# Patient Record
Sex: Female | Born: 2000
Health system: Southern US, Community
[De-identification: ages and names within clinical notes are randomized; demographics above are authoritative.]

## PROBLEM LIST (undated history)

## (undated) DIAGNOSIS — K219 Gastro-esophageal reflux disease without esophagitis: Secondary | ICD-10-CM

## (undated) DIAGNOSIS — D649 Anemia, unspecified: Secondary | ICD-10-CM

## (undated) DIAGNOSIS — Z789 Other specified health status: Secondary | ICD-10-CM

## (undated) DIAGNOSIS — F419 Anxiety disorder, unspecified: Secondary | ICD-10-CM

## (undated) HISTORY — DX: Anxiety disorder, unspecified: F41.9

## (undated) HISTORY — PX: NO PAST SURGERIES: SHX2092

## (undated) HISTORY — DX: Anemia, unspecified: D64.9

---
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
## 2020-01-07 ENCOUNTER — Telehealth (HOSPITAL_COMMUNITY): Payer: Self-pay | Admitting: *Deleted

## 2020-01-07 ENCOUNTER — Encounter (HOSPITAL_COMMUNITY): Payer: Self-pay | Admitting: *Deleted

## 2020-01-07 NOTE — Telephone Encounter (Signed)
Preadmission screen  

## 2020-01-08 ENCOUNTER — Other Ambulatory Visit (HOSPITAL_COMMUNITY)
Admission: RE | Admit: 2020-01-08 | Discharge: 2020-01-08 | Disposition: A | Discharge status: Home / Self Care | Payer: Medicaid Other | Source: Ambulatory Visit | Attending: Obstetrics and Gynecology | Admitting: Obstetrics and Gynecology

## 2020-01-08 DIAGNOSIS — Z20822 Contact with and (suspected) exposure to covid-19: Secondary | ICD-10-CM | POA: Insufficient documentation

## 2020-01-08 DIAGNOSIS — Z01812 Encounter for preprocedural laboratory examination: Secondary | ICD-10-CM | POA: Insufficient documentation

## 2020-01-08 LAB — SARS CORONAVIRUS 2 (TAT 6-24 HRS): SARS Coronavirus 2: NEGATIVE

## 2020-01-09 ENCOUNTER — Encounter (HOSPITAL_COMMUNITY): Payer: Medicaid Other

## 2020-01-10 ENCOUNTER — Other Ambulatory Visit: Payer: Self-pay

## 2020-01-10 ENCOUNTER — Inpatient Hospital Stay (HOSPITAL_COMMUNITY)
Admission: AD | Admit: 2020-01-10 | Discharge: 2020-01-12 | DRG: 806 | Disposition: A | Discharge status: Home / Self Care | Payer: Medicaid Other | Attending: Obstetrics and Gynecology | Admitting: Obstetrics and Gynecology

## 2020-01-10 ENCOUNTER — Inpatient Hospital Stay (HOSPITAL_COMMUNITY): Payer: Medicaid Other

## 2020-01-10 ENCOUNTER — Inpatient Hospital Stay (HOSPITAL_COMMUNITY): Payer: Medicaid Other | Admitting: Anesthesiology

## 2020-01-10 ENCOUNTER — Encounter (HOSPITAL_COMMUNITY): Payer: Self-pay | Admitting: Obstetrics and Gynecology

## 2020-01-10 DIAGNOSIS — D62 Acute posthemorrhagic anemia: Secondary | ICD-10-CM | POA: Diagnosis not present

## 2020-01-10 DIAGNOSIS — F419 Anxiety disorder, unspecified: Secondary | ICD-10-CM | POA: Diagnosis present

## 2020-01-10 DIAGNOSIS — O9081 Anemia of the puerperium: Secondary | ICD-10-CM | POA: Diagnosis not present

## 2020-01-10 DIAGNOSIS — O99344 Other mental disorders complicating childbirth: Secondary | ICD-10-CM | POA: Diagnosis present

## 2020-01-10 DIAGNOSIS — O480 Post-term pregnancy: Principal | ICD-10-CM | POA: Diagnosis present

## 2020-01-10 DIAGNOSIS — O99214 Obesity complicating childbirth: Secondary | ICD-10-CM | POA: Diagnosis present

## 2020-01-10 DIAGNOSIS — R030 Elevated blood-pressure reading, without diagnosis of hypertension: Secondary | ICD-10-CM | POA: Diagnosis not present

## 2020-01-10 DIAGNOSIS — Z3490 Encounter for supervision of normal pregnancy, unspecified, unspecified trimester: Secondary | ICD-10-CM

## 2020-01-10 DIAGNOSIS — Z3A40 40 weeks gestation of pregnancy: Secondary | ICD-10-CM

## 2020-01-10 DIAGNOSIS — O99893 Other specified diseases and conditions complicating puerperium: Secondary | ICD-10-CM | POA: Diagnosis not present

## 2020-01-10 LAB — TYPE AND SCREEN
ABO/RH(D): O POS
Antibody Screen: NEGATIVE

## 2020-01-10 LAB — CBC
HCT: 33 % — ABNORMAL LOW (ref 36.0–46.0)
Hemoglobin: 10.7 g/dL — ABNORMAL LOW (ref 12.0–15.0)
MCH: 26.8 pg (ref 26.0–34.0)
MCHC: 32.4 g/dL (ref 30.0–36.0)
MCV: 82.7 fL (ref 80.0–100.0)
Platelets: 338 10*3/uL (ref 150–400)
RBC: 3.99 MIL/uL (ref 3.87–5.11)
RDW: 13.9 % (ref 11.5–15.5)
WBC: 9.6 10*3/uL (ref 4.0–10.5)
nRBC: 0 % (ref 0.0–0.2)

## 2020-01-10 LAB — RPR: RPR Ser Ql: NONREACTIVE

## 2020-01-10 MED ORDER — COCONUT OIL OIL: 1.0000 "application " | TOPICAL_OIL | Status: DC | PRN

## 2020-01-10 MED ORDER — LIDOCAINE HCL (PF) 1 % IJ SOLN
INTRAMUSCULAR | Status: DC | PRN
  Administered 2020-01-10 (×2): 4 mL via EPIDURAL

## 2020-01-10 MED ORDER — LIDOCAINE HCL (PF) 1 % IJ SOLN: 30.0000 mL | INTRAMUSCULAR | Status: DC | PRN

## 2020-01-10 MED ORDER — METHYLERGONOVINE MALEATE 0.2 MG/ML IJ SOLN: 0.2000 mg | INTRAMUSCULAR | Status: DC | PRN

## 2020-01-10 MED ORDER — TETANUS-DIPHTH-ACELL PERTUSSIS 5-2.5-18.5 LF-MCG/0.5 IM SUSP: 0.5000 mL | Freq: Once | INTRAMUSCULAR | Status: DC

## 2020-01-10 MED ORDER — PHENYLEPHRINE 40 MCG/ML (10ML) SYRINGE FOR IV PUSH (FOR BLOOD PRESSURE SUPPORT): 80.0000 ug | PREFILLED_SYRINGE | INTRAVENOUS | Status: DC | PRN

## 2020-01-10 MED ORDER — LACTATED RINGERS IV SOLN: 500.0000 mL | INTRAVENOUS | Status: DC | PRN

## 2020-01-10 MED ORDER — ONDANSETRON HCL 4 MG/2ML IJ SOLN: 4.0000 mg | Freq: Four times a day (QID) | INTRAMUSCULAR | Status: DC | PRN

## 2020-01-10 MED ORDER — OXYTOCIN BOLUS FROM INFUSION
333.0000 mL | Freq: Once | INTRAVENOUS | Status: AC
  Administered 2020-01-10: 333 mL via INTRAVENOUS

## 2020-01-10 MED ORDER — FENTANYL-BUPIVACAINE-NACL 0.5-0.125-0.9 MG/250ML-% EP SOLN: 12.0000 mL/h | EPIDURAL | Status: DC | PRN

## 2020-01-10 MED ORDER — SIMETHICONE 80 MG PO CHEW: 80.0000 mg | CHEWABLE_TABLET | ORAL | Status: DC | PRN

## 2020-01-10 MED ORDER — SOD CITRATE-CITRIC ACID 500-334 MG/5ML PO SOLN: 30.0000 mL | ORAL | Status: DC | PRN

## 2020-01-10 MED ORDER — OXYCODONE HCL 5 MG PO TABS: 5.0000 mg | ORAL_TABLET | ORAL | Status: DC | PRN

## 2020-01-10 MED ORDER — OXYTOCIN-SODIUM CHLORIDE 30-0.9 UT/500ML-% IV SOLN
1.0000 m[IU]/min | INTRAVENOUS | Status: DC
  Administered 2020-01-10: 2 m[IU]/min via INTRAVENOUS

## 2020-01-10 MED ORDER — LACTATED RINGERS IV SOLN: 500.0000 mL | Freq: Once | INTRAVENOUS | Status: DC

## 2020-01-10 MED ORDER — OXYCODONE-ACETAMINOPHEN 5-325 MG PO TABS: 2.0000 | ORAL_TABLET | ORAL | Status: DC | PRN

## 2020-01-10 MED ORDER — OXYCODONE-ACETAMINOPHEN 5-325 MG PO TABS: 1.0000 | ORAL_TABLET | ORAL | Status: DC | PRN

## 2020-01-10 MED ORDER — FENTANYL-BUPIVACAINE-NACL 0.5-0.125-0.9 MG/250ML-% EP SOLN
EPIDURAL | Status: AC
  Filled 2020-01-10: qty 250

## 2020-01-10 MED ORDER — DIBUCAINE (PERIANAL) 1 % EX OINT: 1.0000 "application " | TOPICAL_OINTMENT | CUTANEOUS | Status: DC | PRN

## 2020-01-10 MED ORDER — DIPHENHYDRAMINE HCL 25 MG PO CAPS: 25.0000 mg | ORAL_CAPSULE | Freq: Four times a day (QID) | ORAL | Status: DC | PRN

## 2020-01-10 MED ORDER — IBUPROFEN 600 MG PO TABS
600.0000 mg | ORAL_TABLET | Freq: Four times a day (QID) | ORAL | Status: DC
  Administered 2020-01-10 – 2020-01-12 (×7): 600 mg via ORAL
  Filled 2020-01-10 (×7): qty 1

## 2020-01-10 MED ORDER — LACTATED RINGERS IV SOLN: INTRAVENOUS | Status: DC

## 2020-01-10 MED ORDER — SENNOSIDES-DOCUSATE SODIUM 8.6-50 MG PO TABS
2.0000 | ORAL_TABLET | ORAL | Status: DC
  Administered 2020-01-11: 2 via ORAL
  Filled 2020-01-10 (×2): qty 2

## 2020-01-10 MED ORDER — ONDANSETRON HCL 4 MG PO TABS: 4.0000 mg | ORAL_TABLET | ORAL | Status: DC | PRN

## 2020-01-10 MED ORDER — EPHEDRINE 5 MG/ML INJ: 10.0000 mg | INTRAVENOUS | Status: DC | PRN

## 2020-01-10 MED ORDER — SODIUM CHLORIDE (PF) 0.9 % IJ SOLN
INTRAMUSCULAR | Status: DC | PRN
  Administered 2020-01-10: 12 mL/h via EPIDURAL

## 2020-01-10 MED ORDER — ACETAMINOPHEN 325 MG PO TABS: 650.0000 mg | ORAL_TABLET | ORAL | Status: DC | PRN

## 2020-01-10 MED ORDER — FENTANYL CITRATE (PF) 100 MCG/2ML IJ SOLN: 100.0000 ug | INTRAMUSCULAR | Status: DC | PRN

## 2020-01-10 MED ORDER — TERBUTALINE SULFATE 1 MG/ML IJ SOLN: 0.2500 mg | Freq: Once | INTRAMUSCULAR | Status: DC | PRN

## 2020-01-10 MED ORDER — DIPHENHYDRAMINE HCL 50 MG/ML IJ SOLN: 12.5000 mg | INTRAMUSCULAR | Status: DC | PRN

## 2020-01-10 MED ORDER — WITCH HAZEL-GLYCERIN EX PADS: 1.0000 "application " | MEDICATED_PAD | CUTANEOUS | Status: DC | PRN

## 2020-01-10 MED ORDER — METHYLERGONOVINE MALEATE 0.2 MG PO TABS: 0.2000 mg | ORAL_TABLET | ORAL | Status: DC | PRN

## 2020-01-10 MED ORDER — MISOPROSTOL 25 MCG QUARTER TABLET
25.0000 ug | ORAL_TABLET | ORAL | Status: DC | PRN
  Filled 2020-01-10: qty 1

## 2020-01-10 MED ORDER — LACTATED RINGERS IV SOLN
500.0000 mL | Freq: Once | INTRAVENOUS | Status: AC
  Administered 2020-01-10: 500 mL via INTRAVENOUS

## 2020-01-10 MED ORDER — OXYTOCIN-SODIUM CHLORIDE 30-0.9 UT/500ML-% IV SOLN
2.5000 [IU]/h | INTRAVENOUS | Status: DC
  Filled 2020-01-10: qty 500

## 2020-01-10 MED ORDER — ZOLPIDEM TARTRATE 5 MG PO TABS: 5.0000 mg | ORAL_TABLET | Freq: Every evening | ORAL | Status: DC | PRN

## 2020-01-10 MED ORDER — PRENATAL MULTIVITAMIN CH
1.0000 | ORAL_TABLET | Freq: Every day | ORAL | Status: DC
  Administered 2020-01-11: 1 via ORAL
  Filled 2020-01-10: qty 1

## 2020-01-10 MED ORDER — OXYCODONE HCL 5 MG PO TABS: 10.0000 mg | ORAL_TABLET | ORAL | Status: DC | PRN

## 2020-01-10 MED ORDER — BENZOCAINE-MENTHOL 20-0.5 % EX AERO: 1.0000 "application " | INHALATION_SPRAY | CUTANEOUS | Status: DC | PRN

## 2020-01-10 MED ORDER — ONDANSETRON HCL 4 MG/2ML IJ SOLN: 4.0000 mg | INTRAMUSCULAR | Status: DC | PRN

## 2020-01-10 NOTE — H&P (Signed)
Jamie Weeks is a 19 y.o. female presenting for postdates IOL  19 yo G1P0 presents for a postdates IOL. Her pregnancy has been complicated by teen pregnancy, anxiety, and anemia. Patient and partner broke up at beginning of pregnancy. Pt appears to have reconciled and has good social support. OB History    Gravida  1   Para      Term      Preterm      AB      Living        SAB      TAB      Ectopic      Multiple      Live Births             Past Medical History:  Diagnosis Date  . Anemia   . Anxiety   . Medical history non-contributory    Past Surgical History:  Procedure Laterality Date  . NO PAST SURGERIES     Family History: family history includes Heart attack in her father; Heart failure in her mother; Hypertension in her mother. Social History:  reports that she has never smoked. She has never used smokeless tobacco. She reports previous alcohol use. She reports previous drug use. Drug: Marijuana.     Maternal Diabetes: No Genetic Screening: Declined, late PNC Maternal Ultrasounds/Referrals: Normal Fetal Ultrasounds or other Referrals:  None Maternal Substance Abuse:  No Significant Maternal Medications:  None Significant Maternal Lab Results:  None Other Comments:  None  Review of Systems History Dilation: 10 Effacement (%): 100 Station: 0 Exam by:: [employer] RN Blood pressure (!) 140/91, pulse 90, temperature 98.4 F (36.9 C), resp. rate 16, last menstrual period 02/18/2019, SpO2 100 %. Exam Physical Exam  Prenatal labs: ABO, Rh: --/--/O POS (07/26 0815) Antibody: NEG (07/26 0815) Rubella: Immune (12/14 0000) RPR: NON REACTIVE (07/26 0815)  HBsAg: Negative (12/14 0000)  HIV: Non-reactive (12/14 0000)  GBS: Negative/-- (06/24 0000)   Assessment/Plan: 1) admit 2) AROM/pit 3) Epidural on request    Waynard Reeds 01/10/2020, 12:41 PM

## 2020-01-10 NOTE — Anesthesia Postprocedure Evaluation (Signed)
Anesthesia Post Note  Patient: MARCHE HOTTENSTEIN  Procedure(s) Performed: AN AD HOC LABOR EPIDURAL     Patient location during evaluation: Mother Baby Anesthesia Type: Epidural Level of consciousness: awake and alert Pain management: pain level controlled Vital Signs Assessment: post-procedure vital signs reviewed and stable Respiratory status: spontaneous breathing, nonlabored ventilation and respiratory function stable Cardiovascular status: stable Postop Assessment: no headache, no backache and epidural receding Anesthetic complications: no   No complications documented.  Last Vitals:  Vitals:   01/10/20 1413 01/10/20 1545  BP: (!) 137/86 (!) 129/68  Pulse: 84 105  Resp: 16   Temp: 36.8 C 36.7 C  SpO2: 100% 100%    Last Pain:  Vitals:   01/10/20 1545  TempSrc: Oral  PainSc: 0-No pain   Pain Goal:                   Tahara Ruffini

## 2020-01-10 NOTE — Anesthesia Procedure Notes (Signed)
Epidural Patient location during procedure: OB Start time: 01/10/2020 8:51 AM End time: 01/10/2020 8:59 AM  Staffing Anesthesiologist: Mal Amabile, MD Performed: anesthesiologist   Preanesthetic Checklist Completed: patient identified, IV checked, site marked, risks and benefits discussed, surgical consent, monitors and equipment checked, pre-op evaluation and timeout performed  Epidural Patient position: sitting Prep: DuraPrep and site prepped and draped Patient monitoring: continuous pulse ox and blood pressure Approach: midline Location: L3-L4 Injection technique: LOR air  Needle:  Needle type: Tuohy  Needle gauge: 17 G Needle length: 9 cm and 9 Needle insertion depth: 8 cm Catheter type: closed end flexible Catheter size: 19 Gauge Catheter at skin depth: 12 cm Test dose: negative and Other  Assessment Events: blood not aspirated, injection not painful, no injection resistance, no paresthesia and negative IV test  Additional Notes Patient identified. Risks and benefits discussed including failed block, incomplete  Pain control, post dural puncture headache, nerve damage, paralysis, blood pressure Changes, nausea, vomiting, reactions to medications-both toxic and allergic and post Partum back pain. All questions were answered. Patient expressed understanding and wished to proceed. Sterile technique was used throughout procedure. Epidural site was Dressed with sterile barrier dressing. No paresthesias, signs of intravascular injection Or signs of intrathecal spread were encountered.  Patient was more comfortable after the epidural was dosed. Please see RN's note for documentation of vital signs and FHR which are stable. Reason for block:procedure for pain

## 2020-01-10 NOTE — Anesthesia Preprocedure Evaluation (Addendum)
Anesthesia Evaluation  Patient identified by MRN, date of birth, ID band Patient awake    Reviewed: Allergy & Precautions, H&P , Patient's Chart, lab work & pertinent test results  Airway Mallampati: II  TM Distance: >3 FB Neck ROM: full    Dental no notable dental hx. (+) Teeth Intact   Pulmonary neg pulmonary ROS,    Pulmonary exam normal breath sounds clear to auscultation       Cardiovascular negative cardio ROS Normal cardiovascular exam Rhythm:regular Rate:Normal     Neuro/Psych negative neurological ROS  negative psych ROS   GI/Hepatic negative GI ROS, Neg liver ROS,   Endo/Other  negative endocrine ROS  Renal/GU negative Renal ROS  negative genitourinary   Musculoskeletal   Abdominal   Peds  Hematology negative hematology ROS (+) Blood dyscrasia, anemia ,   Anesthesia Other Findings   Reproductive/Obstetrics (+) Pregnancy                             Anesthesia Physical Anesthesia Plan  ASA: II  Anesthesia Plan: Epidural   Post-op Pain Management:    Induction:   PONV Risk Score and Plan:   Airway Management Planned:   Additional Equipment:   Intra-op Plan:   Post-operative Plan:   Informed Consent: I have reviewed the patients History and Physical, chart, labs and discussed the procedure including the risks, benefits and alternatives for the proposed anesthesia with the patient or authorized representative who has indicated his/her understanding and acceptance.       Plan Discussed with: Anesthesiologist  Anesthesia Plan Comments:         Anesthesia Quick Evaluation

## 2020-01-11 LAB — CBC
HCT: 30.3 % — ABNORMAL LOW (ref 36.0–46.0)
Hemoglobin: 9.8 g/dL — ABNORMAL LOW (ref 12.0–15.0)
MCH: 26.5 pg (ref 26.0–34.0)
MCHC: 32.3 g/dL (ref 30.0–36.0)
MCV: 81.9 fL (ref 80.0–100.0)
Platelets: 273 10*3/uL (ref 150–400)
RBC: 3.7 MIL/uL — ABNORMAL LOW (ref 3.87–5.11)
RDW: 14.3 % (ref 11.5–15.5)
WBC: 9.1 10*3/uL (ref 4.0–10.5)
nRBC: 0 % (ref 0.0–0.2)

## 2020-01-11 MED ORDER — NORETHINDRONE 0.35 MG PO TABS: 1.0000 | ORAL_TABLET | Freq: Every day | ORAL | 11 refills | Status: DC

## 2020-01-11 MED FILL — NORLYDA 0.35 MG TABS: 0.35 | 28 days supply | Qty: 28 | Fill #0

## 2020-01-11 NOTE — Lactation Note (Signed)
This note was copied from a Jamie's chart. Lactation Consultation Note  Patient Name: Jamie Weeks Date: 01/11/2020  Jamie Weeks now 64 hours old.  Parents report they feel she is breastfeeding well and deny need for lactation assistance at this time.  Parents report no breastfeeding education. Mom reports she has a breast pump for home use. Urged parents to offer the breast based on hunger cues and at least 8-12 or more times day. Reviewed and left [facility] breastfeeding resource list.  Urged parents to call lactation as needed.   Maternal Data    Feeding Feeding Type: Breast Fed  [facility] Score                   Interventions    Lactation Tools Discussed/Used     Consult Status      Soniya Ashraf Michaelle Copas 01/11/2020, 4:24 PM

## 2020-01-11 NOTE — Progress Notes (Signed)
CSW received consult for history of anxiety and depression.  CSW met with MOB to offer support and complete assessment.    MOB resting in bed holding infant with MOB's mother at bedside, when CSW entered the room. MOB's mother left prior to CSW completing assessment. CW introduced self and explained reason for consult to which MOB expressed understanding. MOB pleasant and easy to engage throughout assessment. CSW inquired about MOB's mental health history to which MOB acknowledged history of anxiety and depression since the middle of her pregnancy. MOB confirmed being started on Zoloft during her pregnancy but stated she no longer takes it as she did not like how it made her feel. MOB denied any interest in being restarted on medications or counseling resources. Per MOB, she has a good support system consisting of her mother, cousin and FOB/boyfriend. CSW provided education regarding the baby blues period vs. perinatal mood disorders, discussed treatment and gave resources for mental health follow up if concerns arise. CSW recommends self-evaluation during the postpartum time period using the New Mom Checklist from Postpartum Progress and encouraged MOB to contact a medical professional if symptoms are noted at any time. MOB denied any current SI, HI or DV. CSW aware MOB seen by CSW in 07/2019 for concerns with boyfriend. CSW verified the boyfriend from previous visit was the same boyfriend she was with now. MOB again denied any DV and denied any safety concerns once discharged.   MOB confirmed having all essential items for infant once discharged and stated infant would be sleeping in a crib once home. CSW provided review of Sudden Infant Death Syndrome (SIDS) precautions and safe sleeping habits.    CSW identifies no further need for intervention and no barriers to discharge at this time.  Jamie Asberry, LCSW [facility] [phone]  

## 2020-01-11 NOTE — Progress Notes (Signed)
Post Partum Day 1 Subjective: no complaints, up ad lib, voiding and tolerating PO  Objective: Vitals:   01/10/20 1545 01/10/20 1953 01/11/20 0052 01/11/20 0511  BP: (!) 129/68 (!) 129/90 (!) 124/92 107/69  Pulse: 105 93 91 70  Resp:  17 16 16   Temp: 98 F (36.7 C) 98.2 F (36.8 C) 98.6 F (37 C) 98 F (36.7 C)  TempSrc: Oral Oral Oral Oral  SpO2: 100% 100% 100% 100%     Physical Exam:  General: alert Lochia: appropriate Uterine Fundus: firm DVT Evaluation: No evidence of DVT seen on physical exam.  Recent Labs    01/10/20 0815 01/11/20 0516  WBC 9.6 9.1  HGB 10.7* 9.8*  HCT 33.0* 30.3*  PLT 338 273   Assessment/Plan: 01/13/20 19 y.o. G1P1001 PPD#1 sp TSVD 1. PPC: Hgb 10.7>EBL 27cc, no lacerations > hgb 9.8. Anemia - plan for PO iron at discharge. Rubella immune, blood type O+, breast & bottlefeeding, baby girl Contraception - discussed options, patient desires POPs at discharge and then consider patch vs. IUD at pp visit. Counseled on pelvic rest x6 weeks 2. Chlamydia in pregnancy: positive 12/09/2019, s/p treatment 3. Anxiety: was on zoloft in pregnancy, self d/c due to lack of improvement. Reports mood is stable.  4. Obesity - encouraged ambulation 5. Elevated BP - around delivery and diastolic elevated x2 after delivery. Normotensive this morning.  If BP remains normotensive, and patient desires, OK for 24 hour pp discharge.   LOS: 1 day   Augustine Brannick K Taam-Akelman 01/11/2020, 11:00 AM

## 2020-01-11 NOTE — Lactation Note (Signed)
This note was copied from a baby's chart. Lactation Consultation Note  Patient Name: Jamie Weeks Date: 01/11/2020 Reason for consult: Follow-up assessment   P1, Baby 21 hours old.  Reviewed hand expression w/ drops expressed. Mother states she recently breastfed infant for approx 10 min. Baby sleeping beside mother.  Was feeding in football hold. Reviewed basics including cluster feeding, frequency, feedings cues and provided education on pacifier use. Pacifier use not recommended at this time.  Feed on demand with cues.  Goal 8-12+ times per day after first 24 hrs.  Place baby STS if not cueing.  Mom made aware of O/P services, breastfeeding support groups, community resources, and our phone # for post-discharge questions.     Maternal Data Has patient been taught Hand Expression?: Yes Does the patient have breastfeeding experience prior to this delivery?: No  Feeding Feeding Type: Breast Fed  LATCH Score Latch: Repeated attempts needed to sustain latch, nipple held in mouth throughout feeding, stimulation needed to elicit sucking reflex.  Audible Swallowing: A few with stimulation  Type of Nipple: Everted at rest and after stimulation  Comfort (Breast/Nipple): Soft / non-tender  Hold (Positioning): Assistance needed to correctly position infant at breast and maintain latch.  LATCH Score: 7  Interventions Interventions: Breast feeding basics reviewed;Hand express  Lactation Tools Discussed/Used     Consult Status Consult Status: Follow-up Date: 01/12/20 Follow-up type: In-patient    Dahlia Byes [facility] 01/11/2020, 9:43 AM

## 2020-01-12 NOTE — Discharge Summary (Signed)
Postpartum Discharge Summary     Patient Name: Jamie Weeks DOB: [birth year] MRN: [id number]  Date of admission: 01/10/2020 Delivery date:01/10/2020  Delivering provider: Vanessa Kick  Date of discharge: 01/12/2020  Admitting diagnosis: Pregnancy [Z34.90] Spontaneous vaginal delivery [O80] Intrauterine pregnancy: [redacted]w[redacted]d    Secondary diagnosis:  Active Problems:   Pregnancy   Spontaneous vaginal delivery  Additional problems: teen pregnancy, anxiety, anemia    Discharge diagnosis: Term Pregnancy Delivered and Anemia                                              Post partum procedures:none Augmentation: AROM and Pitocin Complications: None  Hospital course: Induction of Labor With Vaginal Delivery   19y.o. yo G1P1001 at 453w3das admitted to the hospital 01/10/2020 for induction of labor.  Indication for induction: Postdates.  Patient had an uncomplicated labor course as follows: Membrane Rupture Time/Date: 9:37 AM ,01/10/2020   Delivery Method:Vaginal, Spontaneous  Episiotomy: None  Lacerations:  None  Details of delivery can be found in separate delivery note.  Patient had a routine postpartum course. Patient is discharged home 01/12/20.  Newborn Data: Birth date:01/10/2020  Birth time:12:24 PM  Gender:Female  Living status:Living  Apgars:9 ,9  Weight:3019 g   Magnesium Sulfate received: No BMZ received: No Rhophylac:N/A MMR:N/A T-DaP:see office notes Flu: N/A Transfusion:No  Physical exam  Vitals:   01/11/20 0511 01/11/20 1525 01/11/20 1951 01/12/20 0535  BP: 107/69 119/72 124/75 116/82  Pulse: 70 88 86 85  Resp: _0 Temp: 98 F (36.7 C) 98.3 F (36.8 C) 98.4 F (36.9 C) 98.1 F (36.7 C)  TempSrc: Oral Oral Oral Oral  SpO2: 100% 100% 100% 100%   General: alert, cooperative and no distress Lochia: appropriate Uterine Fundus: firm DVT Evaluation: No evidence of DVT seen on physical exam. Labs: Lab Results  Component Value Date   WBC 9.1  01/11/2020   HGB 9.8 (L) 01/11/2020   HCT 30.3 (L) 01/11/2020   MCV 81.9 01/11/2020   PLT 273 01/11/2020   No flowsheet data found. Edinburgh Score: Edinburgh Postnatal Depression Scale Screening Tool 01/12/2020  I have been able to laugh and see the funny side of things. 0  I have looked forward with enjoyment to things. 1  I have blamed myself unnecessarily when things went wrong. 0  I have been anxious or worried for no good reason. 2  I have felt scared or panicky for no good reason. 0  Things have been getting on top of me. 1  I have been so unhappy that I have had difficulty sleeping. 0  I have felt sad or miserable. 1  I have been so unhappy that I have been crying. 0  The thought of harming myself has occurred to me. 0  Edinburgh Postnatal Depression Scale Total 5      After visit meds:  Allergies as of 01/12/2020   No Known Allergies     Medication List    STOP taking these medications   albuterol 108 (90 Base) MCG/ACT inhaler Commonly known as: VENTOLIN HFA   fluticasone 50 MCG/ACT nasal spray Commonly known as: FLONASE   ipratropium 0.06 % nasal spray Commonly known as: ATROVENT   sertraline 25 MG tablet Commonly known as: Zoloft     TAKE these medications   norethindrone 0.35 MG  tablet Commonly known as: MICRONOR Take 1 tablet (0.35 mg total) by mouth daily.        Discharge home in stable condition Infant Feeding: Bottle and Breast Infant Disposition:home with mother Discharge instruction: per After Visit Summary and Postpartum booklet. Activity: Advance as tolerated. Pelvic rest for 6 weeks.  Diet: routine diet Anticipated Birth Control: POPs Postpartum Appointment:4 weeks Additional Postpartum F/U: Postpartum Depression checkup Future Appointments:No future appointments. Follow up Visit:  Follow-up Information    Ross, Kendra, MD Follow up in 4 week(s).   Specialty: Obstetrics and Gynecology Contact information: [address] [place] [address] [phone]                   01/12/2020 Sidney Callahan, DO  

## 2020-01-12 NOTE — Lactation Note (Signed)
This note was copied from a baby's chart. Lactation Consultation Note  Patient Name: Girl Renae Mottley [id number]'[id number] Date: 01/12/2020 Reason for consult: Follow-up assessment   Infant cueing in blankets held by grandma.  Mom has great support from mat. Grandmother.  Mom seems relaxed and states her breasts feel much heavier today.  She is tender on the right nipple.  LC observed and slight bruising noted.  LC reviewed hand expression and discussed importance of hand exp. Prior to latching and when breasts feel full to soften breasts.  Mom demonstrated hand exp.    LC assisted with laid back breastfeed. Infant latched easily with lips flanged.  Mom used compression and massage and swallows easily heard.  Infant fed 15 minutes then mom demonstrated how to break latch after sucking ceased.    Infant was burped and second breast offered. Infant was sleepy and would not latch.  Body was relaxed and arms limp.    LC praised mom for BF.  Encouraged mom to do lots of STS and feed with infant in a diaper only.  Engorgement prevention reviewed.  Mom has [facility] info and phone number for lactation.  She will have her PED. At the [facility] and was encouraged to see Soyla Dryer, [facility] if she desired an OP LC.    LC provided mom with a hand pump and parts and cleaning explained.    Maternal Data    Feeding Feeding Type: Breast Fed  LATCH Score Latch: Grasps breast easily, tongue down, lips flanged, rhythmical sucking.  Audible Swallowing: Spontaneous and intermittent  Type of Nipple: Flat (compressible tissue, mom was taught to sandwich tissue while supporting breast with latch)  Comfort (Breast/Nipple): Filling, red/small blisters or bruises, mild/mod discomfort (tenderness on right side)  Hold (Positioning): Assistance needed to correctly position infant at breast and maintain latch.  LATCH Score: 7  Interventions Interventions: Breast feeding basics reviewed;Assisted with latch;Skin to  skin;Breast massage;Hand express;Support pillows;Position options;Adjust position;Hand pump  Lactation Tools Discussed/Used     Consult Status Consult Status: Complete    [facility] 01/12/2020, 10:56 AM

## 2020-01-31 MED FILL — NORLYDA 0.35 MG TABS: 0.35 | 28 days supply | Qty: 28 | Fill #0

## 2021-03-21 IMAGING — US US OB COMP LESS 14 WK
1 series · 15 of 23 positions shown · non-contrast
Comparison: None.

CLINICAL DATA: 18-year-old pregnant female presents with vaginal
bleeding.

EDC by LMP: 11/25/2019, projecting to an expected gestational age of
12 weeks 4 days.
EXAM:
OBSTETRIC <14 WK ULTRASOUND
TECHNIQUE: Transabdominal ultrasound was performed for evaluation of the
gestation as well as the maternal uterus and adnexal regions.

[Series 1: us ob comp less 14 wk · 15 of 23 slices shown]
[im 1/23]
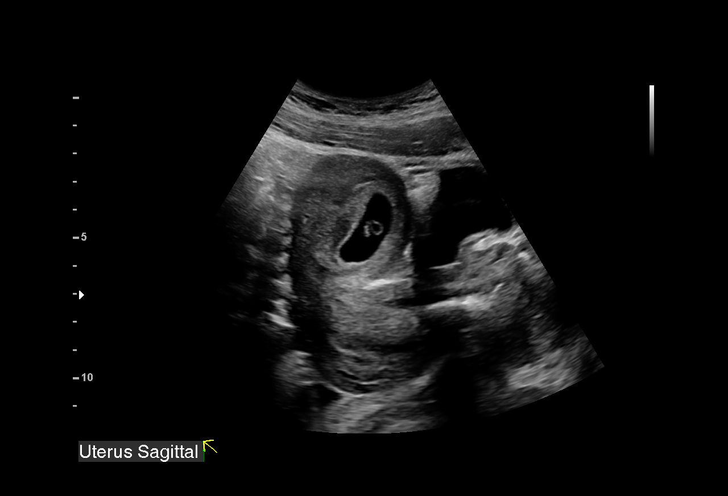
[im 3/23]
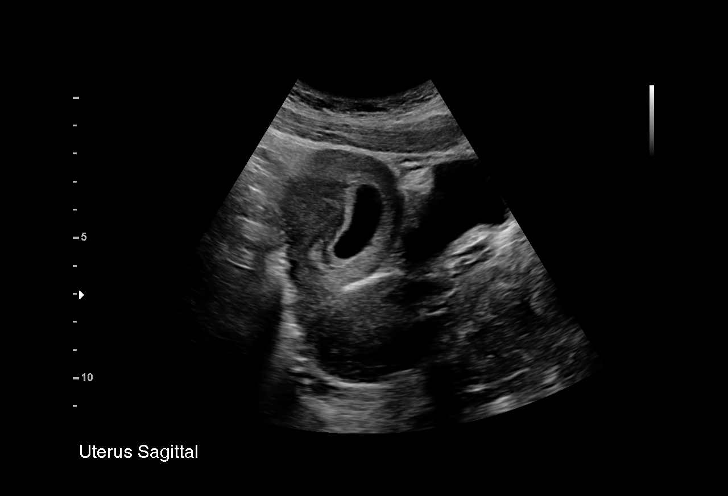
[im 4/23]
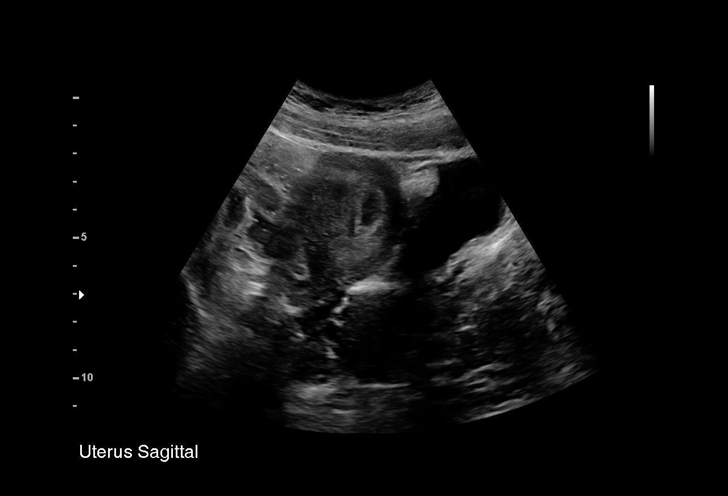
[im 6/23]
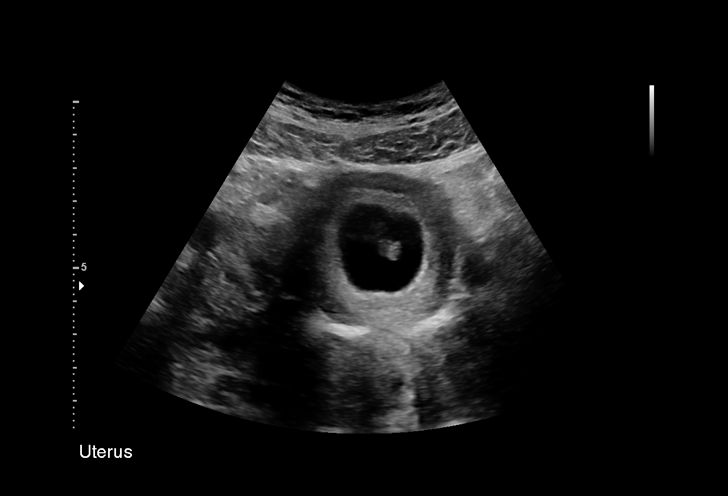
[im 7/23]
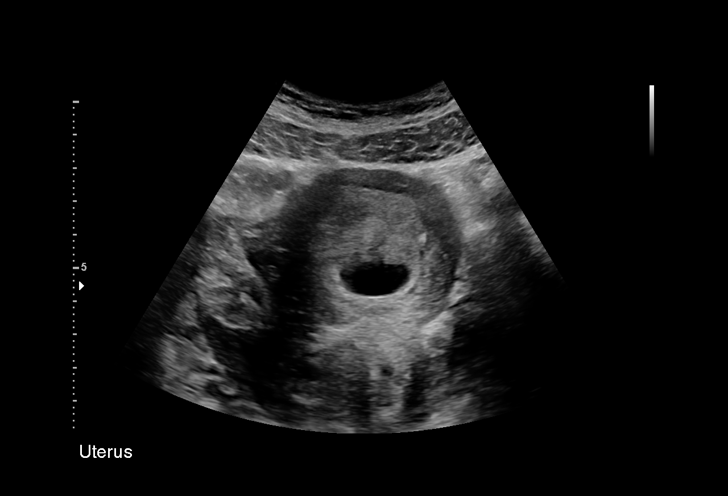
[im 9/23]
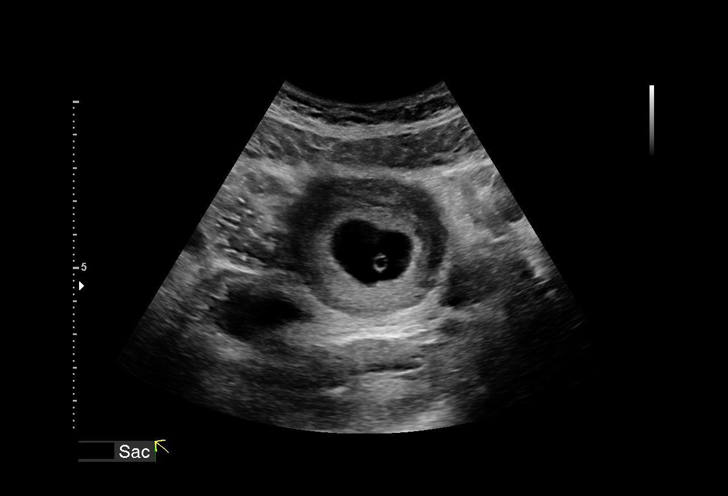
[im 10/23]
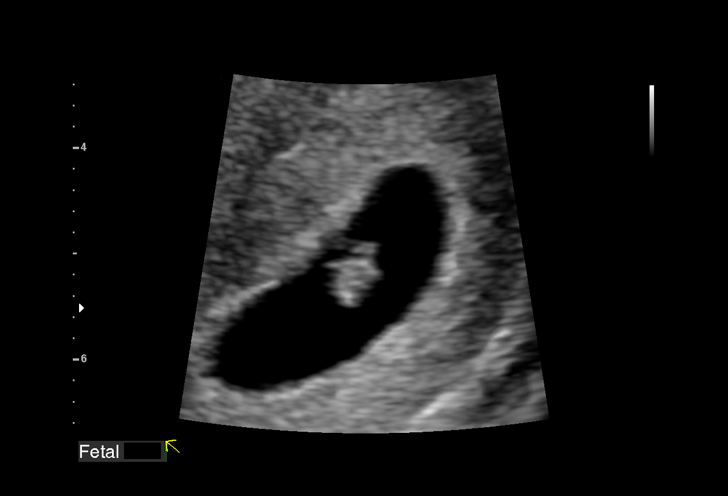
[im 12/23]
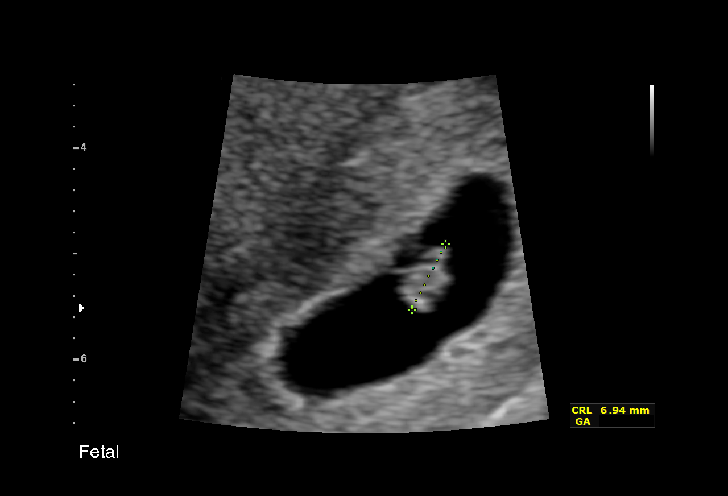
[im 14/23]
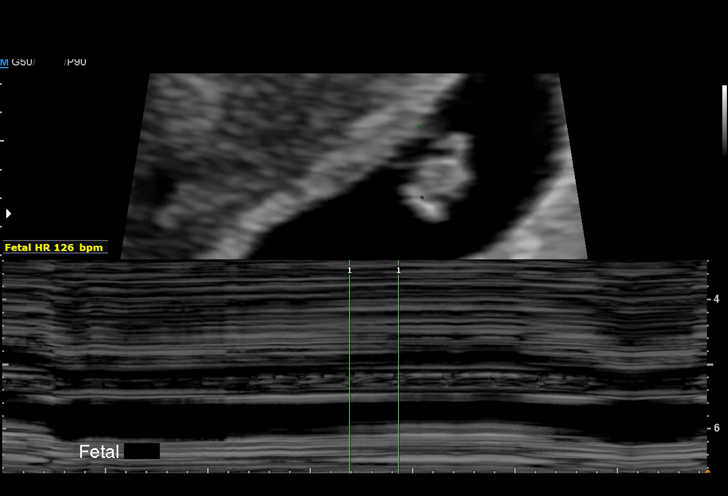
[im 15/23]
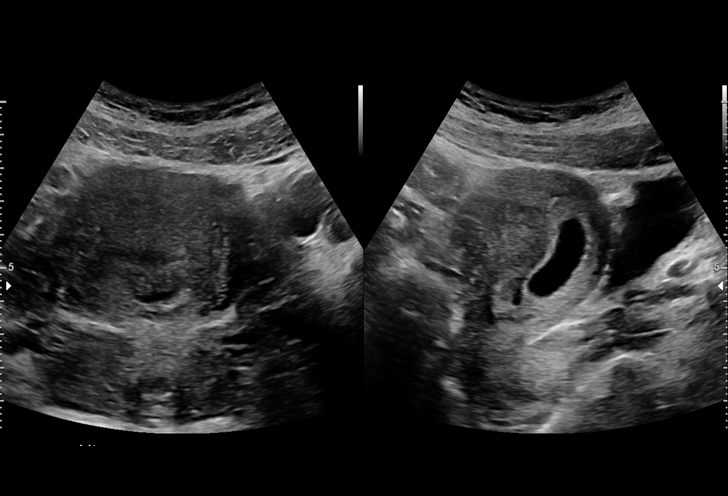
[im 17/23]
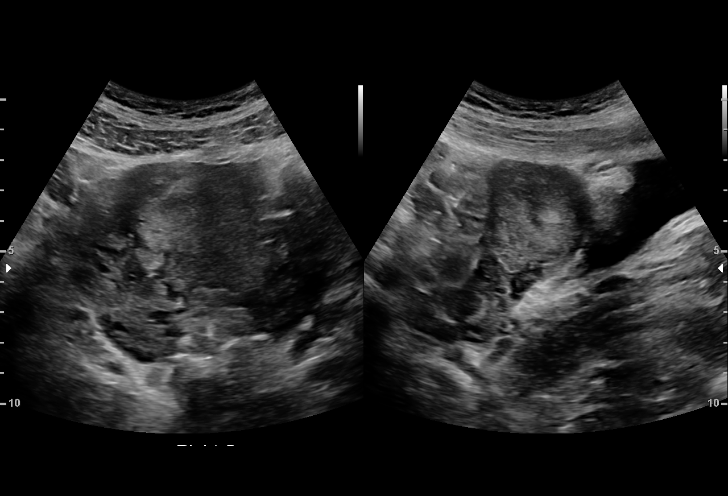
[im 18/23]
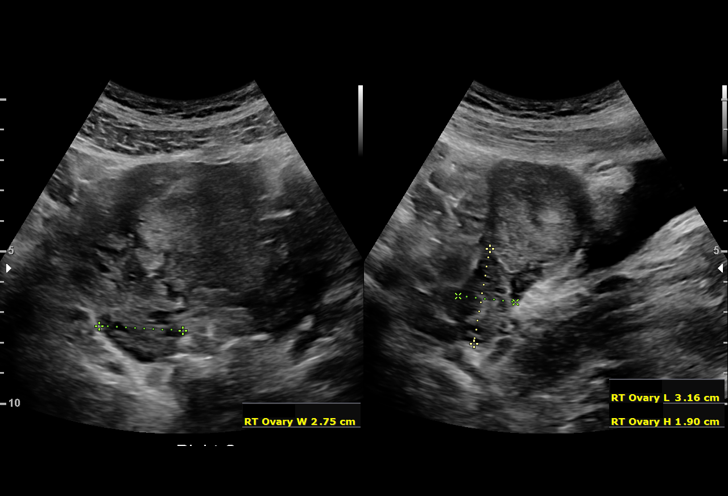
[im 20/23]
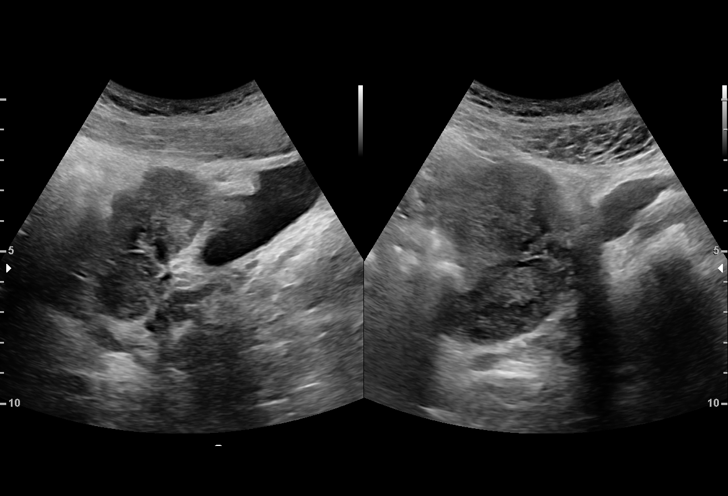
[im 21/23]
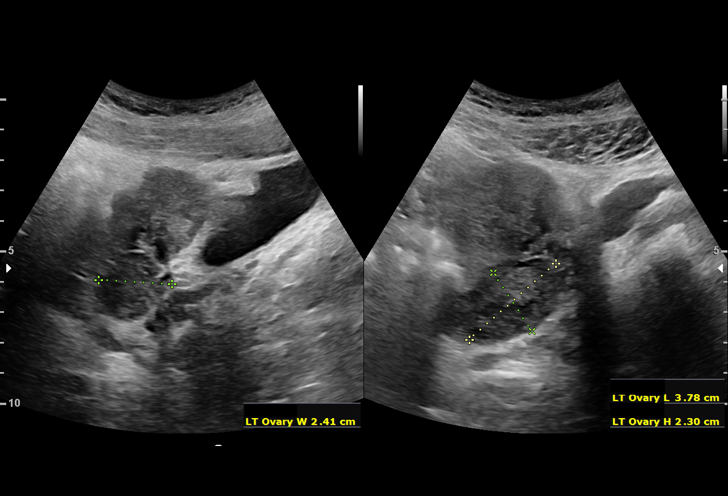
[im 23/23]
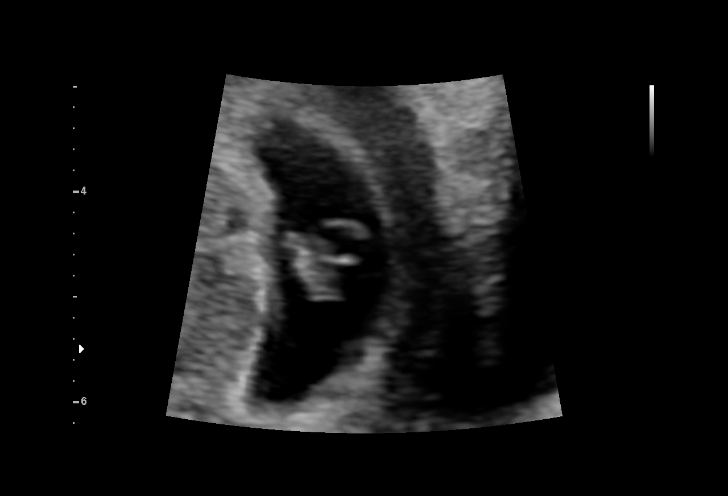

[15 of 23 positions shown; findings below may reference images not displayed]

FINDINGS: Intrauterine gestational sac: Single

Yolk sac:  Visualized.

Embryo:  Visualized.

Cardiac Activity: Visualized.

Heart Rate: 126 bpm

CRL:   6.8 mm   6 w 3 d                  US EDC: 01/07/2020

Subchorionic hemorrhage: Tiny perigestational bleed involving less
than 30% of the gestational sac circumference, measuring 0.9 x 0.7 x
1.2 cm in the lower cavity.

Maternal uterus/adnexae: No uterine fibroids. Right ovary measures
3.2 x 1.9 x 2.8 cm. Left ovary measures 3.8 x 2.3 x 2.4 cm. No
abnormal ovarian or adnexal masses.
IMPRESSION: 1. Single living intrauterine gestation at 6 weeks 3 days by
crown-rump length, discordant with the expected gestational age of
12 weeks 4 days by provided menstrual dating, potentially due to an
error in menstrual dating. Tiny perigestational bleed. Normal
embryonic cardiac activity. Suggest follow-up obstetric scan in 4
weeks to document appropriate fetal growth.
2. No ovarian or adnexal abnormality.

## 2021-03-23 ENCOUNTER — Ambulatory Visit: Payer: Self-pay

## 2021-04-22 ENCOUNTER — Encounter (HOSPITAL_COMMUNITY): Payer: Self-pay

## 2021-04-22 ENCOUNTER — Ambulatory Visit (HOSPITAL_COMMUNITY)
Admission: EM | Admit: 2021-04-22 | Discharge: 2021-04-22 | Disposition: A | Discharge status: Home / Self Care | Payer: Medicaid Other | Attending: Obstetrics and Gynecology | Admitting: Obstetrics and Gynecology

## 2021-04-22 ENCOUNTER — Other Ambulatory Visit: Payer: Self-pay

## 2021-04-22 DIAGNOSIS — N760 Acute vaginitis: Secondary | ICD-10-CM | POA: Diagnosis not present

## 2021-04-22 LAB — POC URINE PREG, ED: Preg Test, Ur: NEGATIVE

## 2021-04-22 NOTE — ED Triage Notes (Signed)
Pt reports discomfort when urinating.   Pt requested STD's test.

## 2021-04-22 NOTE — ED Provider Notes (Signed)
[facility]    CSN: [id number] Arrival date & time: 04/22/21  1332      History   Chief Complaint Chief Complaint  Patient presents with   Exposure to STD   Dysuria    HPI Jamie Weeks is a 20 y.o. female presenting with vaginal discharge and dysuria.  Medical history noncontributory.  Patient describes dysuria and intermittent thin white discharge with external vaginal irritation.  Denies new partners or STI risk. Denies hematuria, frequency, urgency, back pain, n/v/d/abd pain, fevers/chills, abdnormal vaginal rashes/lesions.    HPI  Past Medical History:  Diagnosis Date   Anemia    Anxiety    Medical history non-contributory     Patient Active Problem List   Diagnosis Date Noted   Pregnancy 01/10/2020   Spontaneous vaginal delivery 01/10/2020   Left otitis media with effusion 09/12/2016   Left ear pain 09/12/2016    Past Surgical History:  Procedure Laterality Date   NO PAST SURGERIES      OB History     Gravida  1   Para  1   Term  1   Preterm      AB      Living  1      SAB      IAB      Ectopic      Multiple  0   Live Births  1            Home Medications    Prior to Admission medications   Medication Sig Start Date End Date Taking? Authorizing Provider  norethindrone (MICRONOR) 0.35 MG tablet Take 1 tablet (0.35 mg total) by mouth daily. 01/11/20   Jonelle Sidle, MD    Family History Family History  Problem Relation Age of Onset   Heart failure Mother    Hypertension Mother    Heart attack Father     Social History Social History   Tobacco Use   Smoking status: Never   Smokeless tobacco: Never  Vaping Use   Vaping Use: Former  Substance Use Topics   Alcohol use: Not Currently   Drug use: Not Currently    Types: Marijuana    Comment: last used 04-17-19     Allergies   Patient has no known allergies.   Review of Systems Review of Systems  Constitutional:  Negative for chills and  fever.  HENT:  Negative for sore throat.   Eyes:  Negative for pain and redness.  Respiratory:  Negative for shortness of breath.   Cardiovascular:  Negative for chest pain.  Gastrointestinal:  Negative for abdominal pain, diarrhea, nausea and vomiting.  Genitourinary:  Positive for dysuria and vaginal discharge. Negative for decreased urine volume, difficulty urinating, flank pain, frequency, genital sores, hematuria and urgency.  Musculoskeletal:  Negative for back pain.  Skin:  Negative for rash.  All other systems reviewed and are negative.   Physical Exam Triage Vital Signs ED Triage Vitals  Enc Vitals Group     BP 04/22/21 1509 107/78     Pulse Rate 04/22/21 1509 67     Resp 04/22/21 1509 18     Temp 04/22/21 1509 99.6 F (37.6 C)     Temp Source 04/22/21 1509 Oral     SpO2 04/22/21 1509 100 %     Weight --      Height --      Head Circumference --      Peak Flow --  Pain Score 04/22/21 1447 0     Pain Loc --      Pain Edu? --      Excl. in [place]? --    No data found.  Updated Vital Signs BP 107/78 (BP Location: Right Arm)   Pulse 67   Temp 99.6 F (37.6 C) (Oral)   Resp 18   LMP  (Within Weeks) Comment: 2 weeks  SpO2 100%   Breastfeeding No   Visual Acuity Right Eye Distance:   Left Eye Distance:   Bilateral Distance:    Right Eye Near:   Left Eye Near:    Bilateral Near:     Physical Exam Vitals reviewed.  Constitutional:      General: She is not in acute distress.    Appearance: Normal appearance. She is not ill-appearing.  HENT:     Head: Normocephalic and atraumatic.     Mouth/Throat:     Mouth: Mucous membranes are moist.     Comments: Moist mucous membranes Eyes:     Extraocular Movements: Extraocular movements intact.     Pupils: Pupils are equal, round, and reactive to light.  Cardiovascular:     Rate and Rhythm: Normal rate and regular rhythm.     Heart sounds: Normal heart sounds.  Pulmonary:     Effort: Pulmonary effort is  normal.     Breath sounds: Normal breath sounds. No wheezing, rhonchi or rales.  Abdominal:     General: Bowel sounds are normal. There is no distension.     Palpations: Abdomen is soft. There is no mass.     Tenderness: There is no abdominal tenderness. There is no right CVA tenderness, left CVA tenderness, guarding or rebound.  Genitourinary:    Comments: deferred Skin:    General: Skin is warm.     Capillary Refill: Capillary refill takes less than 2 seconds.     Comments: Good skin turgor  Neurological:     General: No focal deficit present.     Mental Status: She is alert and oriented to person, place, and time.  Psychiatric:        Mood and Affect: Mood normal.        Behavior: Behavior normal.     UC Treatments / Results  Labs (all labs ordered are listed, but only abnormal results are displayed) Labs Reviewed  POC URINE PREG, ED  CERVICOVAGINAL ANCILLARY ONLY    EKG   Radiology No results found.  Procedures Procedures (including critical care time)  Medications Ordered in UC Medications - No data to display  Initial Impression / Assessment and Plan / UC Course  I have reviewed the triage vital signs and the nursing notes.  Pertinent labs & imaging results that were available during my care of the patient were reviewed by me and considered in my medical decision making (see chart for details).     This patient is a very pleasant 20 y.o. year old female presenting with vaginitis. Afebrile, nontachycardic, no reproducible abd pain or CVAT.  Marland Kitchenu-preg negative Will send self-swab for G/C, trich, yeast, BV testing. Declines HIV, RPR. Safe sex precautions. Will wait to treat.   ED return precautions discussed. Patient verbalizes understanding and agreement.    Final Clinical Impressions(s) / UC Diagnoses   Final diagnoses:  Vaginitis and vulvovaginitis     Discharge Instructions      -We have sent testing for sexually transmitted infections. We will  notify you of any positive results once they are  received. If required, we will prescribe any medications you might need. Please refrain from all sexual activity until treatment is complete.  -Seek additional medical attention if you develop fevers/chills, new/worsening abdominal pain, new/worsening vaginal discomfort/discharge, etc.       ED Prescriptions   None    PDMP not reviewed this encounter.   Hazel Sams, PA-C 04/22/21 1711

## 2021-04-22 NOTE — Discharge Instructions (Addendum)
-  We have sent testing for sexually transmitted infections. We will notify you of any positive results once they are received. If required, we will prescribe any medications you might need. Please refrain from all sexual activity until treatment is complete.  -Seek additional medical attention if you develop fevers/chills, new/worsening abdominal pain, new/worsening vaginal discomfort/discharge, etc.   

## 2021-04-22 NOTE — ED Triage Notes (Signed)
Pt was called in lobby no answered.

## 2021-04-23 LAB — CERVICOVAGINAL ANCILLARY ONLY
Bacterial Vaginitis (gardnerella): POSITIVE — AB
Candida Glabrata: NEGATIVE
Candida Vaginitis: NEGATIVE
Chlamydia: NEGATIVE
Comment: NEGATIVE
Comment: NEGATIVE
Comment: NEGATIVE
Comment: NEGATIVE
Comment: NEGATIVE
Comment: NORMAL
Neisseria Gonorrhea: POSITIVE — AB
Trichomonas: NEGATIVE

## 2021-04-25 ENCOUNTER — Other Ambulatory Visit (HOSPITAL_COMMUNITY): Payer: Self-pay

## 2021-04-25 ENCOUNTER — Telehealth (HOSPITAL_COMMUNITY): Payer: Self-pay | Admitting: Emergency Medicine

## 2021-04-25 MED ORDER — METRONIDAZOLE 500 MG PO TABS
500.0000 mg | ORAL_TABLET | Freq: Two times a day (BID) | ORAL | 0 refills | Status: DC
Start: 2021-04-25 — End: 2021-07-19
  Filled 2021-04-25: qty 14, 7d supply, fill #0

## 2021-04-25 NOTE — Telephone Encounter (Signed)
Per protocol, patient will need treatment with IM Rocephin 500mg  for positive Gonorrhea.  Will also need treatment with Metronidazole for positive BV.   Contacted patient by phone.  Verified identity using two identifiers.  Provided positive result.  Reviewed safe sex practices, notifying partners, and refraining from sexual activities for 7 days from time of treatment.  Patient verified understanding, all questions answered.   [facility] notified Verified pharmacy, prescription sent

## 2021-04-26 ENCOUNTER — Ambulatory Visit (HOSPITAL_COMMUNITY): Payer: Self-pay

## 2021-04-27 ENCOUNTER — Ambulatory Visit (HOSPITAL_COMMUNITY)
Admission: RE | Admit: 2021-04-27 | Discharge: 2021-04-27 | Disposition: A | Discharge status: Home / Self Care | Payer: Medicaid Other | Source: Ambulatory Visit | Attending: Internal Medicine | Admitting: Internal Medicine

## 2021-04-27 ENCOUNTER — Other Ambulatory Visit: Payer: Self-pay

## 2021-04-27 DIAGNOSIS — A549 Gonococcal infection, unspecified: Secondary | ICD-10-CM | POA: Diagnosis not present

## 2021-04-27 MED ORDER — CEFTRIAXONE SODIUM 500 MG IJ SOLR
INTRAMUSCULAR | Status: AC
  Filled 2021-04-27: qty 500

## 2021-04-27 MED ORDER — CEFTRIAXONE SODIUM 500 MG IJ SOLR
500.0000 mg | Freq: Once | INTRAMUSCULAR | Status: AC
  Administered 2021-04-27: 500 mg via INTRAMUSCULAR

## 2021-04-27 NOTE — ED Triage Notes (Signed)
Pt here for gonorrhea treatment.

## 2021-05-04 ENCOUNTER — Ambulatory Visit: Payer: Self-pay

## 2021-07-06 ENCOUNTER — Ambulatory Visit (HOSPITAL_COMMUNITY): Payer: Self-pay

## 2021-07-07 ENCOUNTER — Ambulatory Visit (HOSPITAL_COMMUNITY): Payer: Self-pay

## 2021-07-08 ENCOUNTER — Ambulatory Visit (HOSPITAL_COMMUNITY): Payer: Self-pay

## 2021-07-09 ENCOUNTER — Ambulatory Visit (HOSPITAL_COMMUNITY): Payer: Self-pay

## 2021-07-13 ENCOUNTER — Ambulatory Visit (HOSPITAL_COMMUNITY): Payer: Self-pay

## 2021-07-14 ENCOUNTER — Ambulatory Visit (HOSPITAL_COMMUNITY): Payer: Self-pay

## 2021-07-18 ENCOUNTER — Ambulatory Visit (HOSPITAL_COMMUNITY): Payer: Self-pay

## 2021-07-19 ENCOUNTER — Encounter (HOSPITAL_COMMUNITY): Payer: Self-pay

## 2021-07-19 ENCOUNTER — Ambulatory Visit (HOSPITAL_COMMUNITY)
Admission: RE | Admit: 2021-07-19 | Discharge: 2021-07-19 | Disposition: A | Discharge status: Home / Self Care | Payer: Medicaid Other | Source: Ambulatory Visit | Attending: Emergency Medicine | Admitting: Emergency Medicine

## 2021-07-19 ENCOUNTER — Other Ambulatory Visit: Payer: Self-pay

## 2021-07-19 VITALS — BP 115/75 | HR 81 | Temp 98.4°F | Resp 17

## 2021-07-19 DIAGNOSIS — R300 Dysuria: Secondary | ICD-10-CM | POA: Insufficient documentation

## 2021-07-19 LAB — POCT URINALYSIS DIPSTICK, ED / UC
Bilirubin Urine: NEGATIVE
Glucose, UA: NEGATIVE mg/dL
Hgb urine dipstick: NEGATIVE
Ketones, ur: NEGATIVE mg/dL
Nitrite: NEGATIVE
Protein, ur: NEGATIVE mg/dL
Specific Gravity, Urine: 1.015 (ref 1.005–1.030)
Urobilinogen, UA: 0.2 mg/dL (ref 0.0–1.0)
pH: 6 (ref 5.0–8.0)

## 2021-07-19 LAB — POC URINE PREG, ED: Preg Test, Ur: NEGATIVE

## 2021-07-19 MED ORDER — PHENAZOPYRIDINE HCL 200 MG PO TABS: 200.0000 mg | ORAL_TABLET | Freq: Three times a day (TID) | ORAL | 0 refills | Status: DC

## 2021-07-19 NOTE — ED Provider Notes (Signed)
[facility]    CSN: [id number] Arrival date & time: 07/19/21  1845    HISTORY   Chief Complaint  Patient presents with   Appointment   HPI Jamie Weeks is a 21 y.o. female. Patient complains of painful urination for the past week, has not tried any remedies for this, states also has some mid abdominal pain that is intermittent and not associated with nausea, UPT is negative.  Patient has a history of bacterial vaginitis and gonorrhea in November 2022.  The history is provided by the patient.  Past Medical History:  Diagnosis Date   Anemia    Anxiety    Medical history non-contributory    Patient Active Problem List   Diagnosis Date Noted   Pregnancy 01/10/2020   Spontaneous vaginal delivery 01/10/2020   Left otitis media with effusion 09/12/2016   Left ear pain 09/12/2016   Past Surgical History:  Procedure Laterality Date   NO PAST SURGERIES     OB History     Gravida  1   Para  1   Term  1   Preterm      AB      Living  1      SAB      IAB      Ectopic      Multiple  0   Live Births  1          Home Medications    Prior to Admission medications   Medication Sig Start Date End Date Taking? Authorizing Provider  norethindrone (MICRONOR) 0.35 MG tablet Take 1 tablet (0.35 mg total) by mouth daily. 01/11/20   Jonelle Sidle, MD   Family History Family History  Problem Relation Age of Onset   Heart failure Mother    Hypertension Mother    Heart attack Father    Social History Social History   Tobacco Use   Smoking status: Never   Smokeless tobacco: Never  Vaping Use   Vaping Use: Former  Substance Use Topics   Alcohol use: Not Currently   Drug use: Not Currently    Types: Marijuana    Comment: last used 04-17-19   Allergies   Patient has no known allergies.  Review of Systems Review of Systems Pertinent findings noted in history of present illness.   Physical Exam Triage Vital Signs ED Triage  Vitals  Enc Vitals Group     BP 04/13/21 0827 (!) 147/82     Pulse Rate 04/13/21 0827 72     Resp 04/13/21 0827 18     Temp 04/13/21 0827 98.3 F (36.8 C)     Temp Source 04/13/21 0827 Oral     SpO2 04/13/21 0827 98 %     Weight --      Height --      Head Circumference --      Peak Flow --      Pain Score 04/13/21 0826 5     Pain Loc --      Pain Edu? --      Excl. in [place]? --   No data found.  Updated Vital Signs BP 115/75 (BP Location: Right Arm)    Pulse 81    Temp 98.4 F (36.9 C) (Oral)    Resp 17    LMP 06/30/2021 (Exact Date)    SpO2 98%   Physical Exam Vitals and nursing note reviewed.  Constitutional:      General: She is not in acute  distress.    Appearance: Normal appearance. She is not ill-appearing.  HENT:     Head: Normocephalic and atraumatic.  Eyes:     General: Lids are normal.        Right eye: No discharge.        Left eye: No discharge.     Extraocular Movements: Extraocular movements intact.     Conjunctiva/sclera: Conjunctivae normal.     Right eye: Right conjunctiva is not injected.     Left eye: Left conjunctiva is not injected.  Neck:     Trachea: Trachea and phonation normal.  Cardiovascular:     Rate and Rhythm: Normal rate and regular rhythm.     Pulses: Normal pulses.     Heart sounds: Normal heart sounds. No murmur heard.   No friction rub. No gallop.  Pulmonary:     Effort: Pulmonary effort is normal. No accessory muscle usage, prolonged expiration or respiratory distress.     Breath sounds: Normal breath sounds. No stridor, decreased air movement or transmitted upper airway sounds. No decreased breath sounds, wheezing, rhonchi or rales.  Chest:     Chest wall: No tenderness.  Abdominal:     General: Abdomen is flat. Bowel sounds are normal. There is no distension.     Palpations: Abdomen is soft.     Tenderness: There is abdominal tenderness in the periumbilical area. There is no right CVA tenderness or left CVA tenderness.      Hernia: No hernia is present.  Genitourinary:    Comments: Patient politely declines pelvic exam today, patient provided a vaginal swab for testing. Musculoskeletal:        General: Normal range of motion.     Cervical back: Normal range of motion and neck supple. Normal range of motion.  Lymphadenopathy:     Cervical: No cervical adenopathy.  Skin:    General: Skin is warm and dry.     Findings: No erythema or rash.  Neurological:     General: No focal deficit present.     Mental Status: She is alert and oriented to person, place, and time.  Psychiatric:        Mood and Affect: Mood normal.        Behavior: Behavior normal.    Visual Acuity Right Eye Distance:   Left Eye Distance:   Bilateral Distance:    Right Eye Near:   Left Eye Near:    Bilateral Near:     UC Couse / Diagnostics / Procedures:    EKG  Radiology No results found.  Procedures Procedures (including critical care time)  UC Diagnoses / Final Clinical Impressions(s)   I have reviewed the triage vital signs and the nursing notes.  Pertinent labs & imaging results that were available during my care of the patient were reviewed by me and considered in my medical decision making (see chart for details).    Final diagnoses:  Dysuria   STD screening was performed, patient advised that the results be posted to their MyChart and if any of the results are positive, they will be notified by phone, further treatment will be provided as indicated based on results of STD screening.  Urine dip today was positive for  trace leuks .  Urine culture will be performed per our protocol.  Patient advised that they will be contacted with results and that adjustments to treatment will be provided as indicated based on the results.   Patient was provided with Pyridium for comfort  while waiting for urine culture results.  ED Prescriptions     Medication Sig Dispense Auth. Provider   phenazopyridine (PYRIDIUM) 200 MG tablet  Take 1 tablet (200 mg total) by mouth 3 (three) times daily. 6 tablet Lynden Oxford Scales, PA-C      PDMP not reviewed this encounter.  Pending results:  Labs Reviewed  POCT URINALYSIS DIPSTICK, ED / UC - Abnormal; Notable for the following components:      Result Value   Leukocytes,Ua TRACE (*)    All other components within normal limits  POCT URINALYSIS DIP (MANUAL ENTRY)  POCT URINE PREGNANCY  POC URINE PREG, ED  CERVICOVAGINAL ANCILLARY ONLY    Medications Ordered in UC: Medications - No data to display  Disposition Upon Discharge:  Condition: stable for discharge home  Patient presented with concern for an acute illness with associated systemic symptoms and significant discomfort requiring urgent management. In my opinion, this is a condition that a prudent lay person (someone who possesses an average knowledge of health and medicine) may potentially expect to result in complications if not addressed urgently such as respiratory distress, impairment of bodily function or dysfunction of bodily organs.   As such, the patient has been evaluated and assessed, work-up was performed and treatment was provided in alignment with urgent care protocols and evidence based medicine.  Patient/parent/caregiver has been advised that the patient may require follow up for further testing and/or treatment if the symptoms continue in spite of treatment, as clinically indicated and appropriate.  Routine symptom specific, illness specific and/or disease specific instructions were discussed with the patient and/or caregiver at length.  Prevention strategies for avoiding STD exposure were also discussed.  The patient will follow up with their current PCP if and as advised. If the patient does not currently have a PCP we will assist them in obtaining one.   The patient may need specialty follow up if the symptoms continue, in spite of conservative treatment and management, for further workup,  evaluation, consultation and treatment as clinically indicated and appropriate.  Patient/parent/caregiver verbalized understanding and agreement of plan as discussed.  All questions were addressed during visit.  Please see discharge instructions below for further details of plan.  Discharge Instructions:   Discharge Instructions      The results of your STD testing today will be made available to you once received.  They will initially be posted to your MyChart and, if any of your results are abnormal, you will receive a phone call with those results along with further instructions regarding treatment.   The urinalysis that we performed in the clinic today showed trace white blood cells but not a significant amount.  Because you are having symptoms, urine culture will be performed.  The results of the urine culture will be available in the next 3 to 5 days and will be posted to your MyChart account.  If there is an abnormal finding, you will be contacted by phone and advised of further treatment recommendations, if any.   Thank you for visiting urgent care today and allowing me the opportunity to participate in your care.       This office note has been dictated using Museum/gallery curator.  Unfortunately, and despite my best efforts, this method of dictation can sometimes lead to occasional typographical or grammatical errors.  I apologize in advance if this occurs.      Lynden Oxford Scales, [place] 07/19/21 1954

## 2021-07-19 NOTE — ED Triage Notes (Signed)
Pt presents with c/o painful urination X 1 week.

## 2021-07-19 NOTE — Discharge Instructions (Signed)
The results of your STD testing today will be made available to you once received.  They will initially be posted to your MyChart and, if any of your results are abnormal, you will receive a phone call with those results along with further instructions regarding treatment.   The urinalysis that we performed in the clinic today showed trace white blood cells but not a significant amount.  Because you are having symptoms, urine culture will be performed.  The results of the urine culture will be available in the next 3 to 5 days and will be posted to your MyChart account.  If there is an abnormal finding, you will be contacted by phone and advised of further treatment recommendations, if any.   Thank you for visiting urgent care today and allowing me the opportunity to participate in your care.

## 2021-07-20 ENCOUNTER — Telehealth (HOSPITAL_COMMUNITY): Payer: Self-pay | Admitting: Emergency Medicine

## 2021-07-20 LAB — CERVICOVAGINAL ANCILLARY ONLY
Bacterial Vaginitis (gardnerella): POSITIVE — AB
Candida Glabrata: NEGATIVE
Candida Vaginitis: NEGATIVE
Chlamydia: NEGATIVE
Comment: NEGATIVE
Comment: NEGATIVE
Comment: NEGATIVE
Comment: NEGATIVE
Comment: NEGATIVE
Comment: NORMAL
Neisseria Gonorrhea: POSITIVE — AB
Trichomonas: NEGATIVE

## 2021-07-20 MED ORDER — METRONIDAZOLE 500 MG PO TABS: 500.0000 mg | ORAL_TABLET | Freq: Two times a day (BID) | ORAL | 0 refills | Status: DC

## 2021-07-22 ENCOUNTER — Ambulatory Visit (HOSPITAL_COMMUNITY): Payer: Self-pay

## 2021-07-23 ENCOUNTER — Ambulatory Visit (HOSPITAL_COMMUNITY)
Admission: RE | Admit: 2021-07-23 | Discharge: 2021-07-23 | Disposition: A | Discharge status: Home / Self Care | Payer: Medicaid Other | Source: Ambulatory Visit | Attending: Family Medicine | Admitting: Family Medicine

## 2021-07-23 ENCOUNTER — Other Ambulatory Visit: Payer: Self-pay

## 2021-07-23 DIAGNOSIS — A549 Gonococcal infection, unspecified: Secondary | ICD-10-CM | POA: Diagnosis not present

## 2021-07-23 MED ORDER — CEFTRIAXONE SODIUM 500 MG IJ SOLR
500.0000 mg | Freq: Once | INTRAMUSCULAR | Status: AC
  Administered 2021-07-23: 500 mg via INTRAMUSCULAR

## 2021-07-23 MED ORDER — CEFTRIAXONE SODIUM 500 MG IJ SOLR
INTRAMUSCULAR | Status: AC
  Filled 2021-07-23: qty 500

## 2021-07-23 MED ORDER — LIDOCAINE HCL (PF) 1 % IJ SOLN
INTRAMUSCULAR | Status: AC
  Filled 2021-07-23: qty 2

## 2021-07-23 NOTE — ED Triage Notes (Signed)
Pt presents for STD treatment.   Pt denies concerns.

## 2022-02-01 ENCOUNTER — Ambulatory Visit (HOSPITAL_COMMUNITY)
Admission: RE | Admit: 2022-02-01 | Discharge: 2022-02-01 | Disposition: A | Discharge status: Home / Self Care | Payer: Medicaid Other | Source: Ambulatory Visit | Attending: Family Medicine | Admitting: Family Medicine

## 2022-02-01 ENCOUNTER — Encounter (HOSPITAL_COMMUNITY): Payer: Self-pay

## 2022-02-01 VITALS — BP 115/63 | HR 79 | Temp 98.8°F | Resp 18

## 2022-02-01 DIAGNOSIS — N898 Other specified noninflammatory disorders of vagina: Secondary | ICD-10-CM | POA: Insufficient documentation

## 2022-02-01 DIAGNOSIS — Z202 Contact with and (suspected) exposure to infections with a predominantly sexual mode of transmission: Secondary | ICD-10-CM | POA: Diagnosis not present

## 2022-02-01 LAB — POC URINE PREG, ED: Preg Test, Ur: NEGATIVE

## 2022-02-01 MED ORDER — CEFTRIAXONE SODIUM 500 MG IJ SOLR
INTRAMUSCULAR | Status: AC
  Filled 2022-02-01: qty 500

## 2022-02-01 MED ORDER — LIDOCAINE HCL (PF) 1 % IJ SOLN
INTRAMUSCULAR | Status: AC
  Filled 2022-02-01: qty 2

## 2022-02-01 MED ORDER — CEFTRIAXONE SODIUM 500 MG IJ SOLR
500.0000 mg | Freq: Once | INTRAMUSCULAR | Status: AC
  Administered 2022-02-01: 500 mg via INTRAMUSCULAR

## 2022-02-01 NOTE — Discharge Instructions (Addendum)
You were seen today for vaginal discharge, as well as exposure to STD.  You were treated today for possible gonorrhea with a shot of rocephin.  We will await the results of the vaginal swab before further treatment is given.  Please avoid intercourse until all results are complete.

## 2022-02-01 NOTE — ED Provider Notes (Signed)
[facility]    CSN: [id number] Arrival date & time: 02/01/22  1358      History   Chief Complaint Chief Complaint  Patient presents with   Exposure to STD    Entered by patient    HPI Jamie Weeks is a 21 y.o. female.   Patient is here for std exposure.  Received a call today about an std exposure, not sure what it is.  She is having vaginal discharge, white, thick d/c.  + odor;  no itching.  Some abdominal pain as well.  LMP was 3 weeks ago.  No birth control.   Past Medical History:  Diagnosis Date   Anemia    Anxiety    Medical history non-contributory     Patient Active Problem List   Diagnosis Date Noted   Pregnancy 01/10/2020   Spontaneous vaginal delivery 01/10/2020   Left otitis media with effusion 09/12/2016   Left ear pain 09/12/2016    Past Surgical History:  Procedure Laterality Date   NO PAST SURGERIES      OB History     Gravida  1   Para  1   Term  1   Preterm      AB      Living  1      SAB      IAB      Ectopic      Multiple  0   Live Births  1            Home Medications    Prior to Admission medications   Not on File    Family History Family History  Problem Relation Age of Onset   Heart failure Mother    Hypertension Mother    Heart attack Father     Social History Social History   Tobacco Use   Smoking status: Never   Smokeless tobacco: Never  Vaping Use   Vaping Use: Former  Substance Use Topics   Alcohol use: Not Currently   Drug use: Not Currently    Types: Marijuana    Comment: last used 04-17-19     Allergies   Patient has no known allergies.   Review of Systems Review of Systems   Physical Exam Triage Vital Signs ED Triage Vitals  Enc Vitals Group     BP 02/01/22 1450 115/63     Pulse Rate 02/01/22 1450 79     Resp 02/01/22 1450 18     Temp 02/01/22 1450 98.8 F (37.1 C)     Temp Source 02/01/22 1450 Oral     SpO2 02/01/22 1450 100 %     Weight --       Height --      Head Circumference --      Peak Flow --      Pain Score 02/01/22 1451 0     Pain Loc --      Pain Edu? --      Excl. in [place]? --    No data found.  Updated Vital Signs BP 115/63 (BP Location: Left Arm)   Pulse 79   Temp 98.8 F (37.1 C) (Oral)   Resp 18   LMP 01/10/2022   SpO2 100%   Visual Acuity Right Eye Distance:   Left Eye Distance:   Bilateral Distance:    Right Eye Near:   Left Eye Near:    Bilateral Near:     Physical Exam Constitutional:  Appearance: Normal appearance.  Cardiovascular:     Rate and Rhythm: Normal rate and regular rhythm.  Pulmonary:     Effort: Pulmonary effort is normal.     Breath sounds: Normal breath sounds.  Abdominal:     Palpations: Abdomen is soft.     Tenderness: There is abdominal tenderness.  Skin:    General: Skin is warm.  Neurological:     General: No focal deficit present.     Mental Status: She is alert.  Psychiatric:        Mood and Affect: Mood normal.      UC Treatments / Results  Labs (all labs ordered are listed, but only abnormal results are displayed) Labs Reviewed  POC URINE PREG, ED  CERVICOVAGINAL ANCILLARY ONLY    EKG   Radiology No results found.  Procedures Procedures (including critical care time)  Medications Ordered in UC Medications  cefTRIAXone (ROCEPHIN) injection 500 mg (500 mg Intramuscular Given 02/01/22 1518)    Initial Impression / Assessment and Plan / UC Course  I have reviewed the triage vital signs and the nursing notes.  Pertinent labs & imaging results that were available during my care of the patient were reviewed by me and considered in my medical decision making (see chart for details).     Final Clinical Impressions(s) / UC Diagnoses   Final diagnoses:  Vaginal discharge  Exposure to STD     Discharge Instructions      You were seen today for vaginal discharge, as well as exposure to STD.  You were treated today for possible gonorrhea  with a shot of rocephin.  We will await the results of the vaginal swab before further treatment is given.  Please avoid intercourse until all results are complete.      ED Prescriptions   None    PDMP not reviewed this encounter.   Jannifer Franklin, MD 02/01/22 1527

## 2022-02-01 NOTE — ED Triage Notes (Signed)
Pt states received a call that possible std exposure. Pt c/o thick white vag d/c x2wks.

## 2022-02-04 LAB — CERVICOVAGINAL ANCILLARY ONLY
Bacterial Vaginitis (gardnerella): POSITIVE — AB
Candida Glabrata: NEGATIVE
Candida Vaginitis: POSITIVE — AB
Chlamydia: POSITIVE — AB
Comment: NEGATIVE
Comment: NEGATIVE
Comment: NEGATIVE
Comment: NEGATIVE
Comment: NEGATIVE
Comment: NORMAL
Neisseria Gonorrhea: NEGATIVE
Trichomonas: NEGATIVE

## 2022-02-05 ENCOUNTER — Telehealth (HOSPITAL_COMMUNITY): Payer: Self-pay | Admitting: Emergency Medicine

## 2022-02-05 MED ORDER — FLUCONAZOLE 150 MG PO TABS: 150.0000 mg | ORAL_TABLET | Freq: Once | ORAL | 0 refills | Status: AC

## 2022-02-05 MED ORDER — DOXYCYCLINE HYCLATE 100 MG PO CAPS: 100.0000 mg | ORAL_CAPSULE | Freq: Two times a day (BID) | ORAL | 0 refills | Status: AC

## 2022-02-05 MED ORDER — METRONIDAZOLE 500 MG PO TABS: 500.0000 mg | ORAL_TABLET | Freq: Two times a day (BID) | ORAL | 0 refills | Status: DC

## 2022-02-06 ENCOUNTER — Other Ambulatory Visit (HOSPITAL_COMMUNITY): Payer: Self-pay

## 2022-02-21 ENCOUNTER — Ambulatory Visit (HOSPITAL_COMMUNITY): Payer: Self-pay

## 2022-03-08 ENCOUNTER — Inpatient Hospital Stay (HOSPITAL_COMMUNITY)
Admission: EM | Admit: 2022-03-08 | Discharge: 2022-03-10 | DRG: 493 | Disposition: A | Discharge status: Home / Self Care | Payer: Medicaid Other | Attending: Orthopedic Surgery | Admitting: Orthopedic Surgery

## 2022-03-08 ENCOUNTER — Emergency Department (HOSPITAL_COMMUNITY): Payer: Medicaid Other

## 2022-03-08 ENCOUNTER — Encounter (HOSPITAL_COMMUNITY): Payer: Self-pay | Admitting: Emergency Medicine

## 2022-03-08 ENCOUNTER — Other Ambulatory Visit: Payer: Self-pay

## 2022-03-08 DIAGNOSIS — Z20822 Contact with and (suspected) exposure to covid-19: Secondary | ICD-10-CM | POA: Diagnosis present

## 2022-03-08 DIAGNOSIS — S82831A Other fracture of upper and lower end of right fibula, initial encounter for closed fracture: Secondary | ICD-10-CM | POA: Diagnosis present

## 2022-03-08 DIAGNOSIS — S82861A Displaced Maisonneuve's fracture of right leg, initial encounter for closed fracture: Secondary | ICD-10-CM | POA: Diagnosis present

## 2022-03-08 DIAGNOSIS — Z8249 Family history of ischemic heart disease and other diseases of the circulatory system: Secondary | ICD-10-CM

## 2022-03-08 DIAGNOSIS — S82201A Unspecified fracture of shaft of right tibia, initial encounter for closed fracture: Secondary | ICD-10-CM

## 2022-03-08 DIAGNOSIS — S93431A Sprain of tibiofibular ligament of right ankle, initial encounter: Secondary | ICD-10-CM | POA: Diagnosis present

## 2022-03-08 DIAGNOSIS — F1290 Cannabis use, unspecified, uncomplicated: Secondary | ICD-10-CM | POA: Diagnosis present

## 2022-03-08 DIAGNOSIS — S82871A Displaced pilon fracture of right tibia, initial encounter for closed fracture: Principal | ICD-10-CM | POA: Diagnosis present

## 2022-03-08 DIAGNOSIS — E559 Vitamin D deficiency, unspecified: Secondary | ICD-10-CM | POA: Diagnosis present

## 2022-03-08 DIAGNOSIS — D62 Acute posthemorrhagic anemia: Secondary | ICD-10-CM | POA: Diagnosis not present

## 2022-03-08 DIAGNOSIS — Y92410 Unspecified street and highway as the place of occurrence of the external cause: Secondary | ICD-10-CM

## 2022-03-08 DIAGNOSIS — S82209A Unspecified fracture of shaft of unspecified tibia, initial encounter for closed fracture: Secondary | ICD-10-CM | POA: Diagnosis present

## 2022-03-08 DIAGNOSIS — K219 Gastro-esophageal reflux disease without esophagitis: Secondary | ICD-10-CM | POA: Diagnosis present

## 2022-03-08 HISTORY — DX: Gastro-esophageal reflux disease without esophagitis: K21.9

## 2022-03-08 LAB — I-STAT CHEM 8, ED
BUN: 7 mg/dL (ref 6–20)
Calcium, Ion: 1.08 mmol/L — ABNORMAL LOW (ref 1.15–1.40)
Chloride: 103 mmol/L (ref 98–111)
Creatinine, Ser: 0.8 mg/dL (ref 0.44–1.00)
Glucose, Bld: 102 mg/dL — ABNORMAL HIGH (ref 70–99)
HCT: 39 % (ref 36.0–46.0)
Hemoglobin: 13.3 g/dL (ref 12.0–15.0)
Potassium: 3.4 mmol/L — ABNORMAL LOW (ref 3.5–5.1)
Sodium: 139 mmol/L (ref 135–145)
TCO2: 20 mmol/L — ABNORMAL LOW (ref 22–32)

## 2022-03-08 LAB — CBC
HCT: 38.3 % (ref 36.0–46.0)
Hemoglobin: 12.4 g/dL (ref 12.0–15.0)
MCH: 27.1 pg (ref 26.0–34.0)
MCHC: 32.4 g/dL (ref 30.0–36.0)
MCV: 83.6 fL (ref 80.0–100.0)
Platelets: 392 10*3/uL (ref 150–400)
RBC: 4.58 MIL/uL (ref 3.87–5.11)
RDW: 14.6 % (ref 11.5–15.5)
WBC: 5.7 10*3/uL (ref 4.0–10.5)
nRBC: 0 % (ref 0.0–0.2)

## 2022-03-08 LAB — PROTIME-INR
INR: 1.1 (ref 0.8–1.2)
Prothrombin Time: 13.8 seconds (ref 11.4–15.2)

## 2022-03-08 LAB — COMPREHENSIVE METABOLIC PANEL
ALT: 13 U/L (ref 0–44)
AST: 22 U/L (ref 15–41)
Albumin: 3.7 g/dL (ref 3.5–5.0)
Alkaline Phosphatase: 49 U/L (ref 38–126)
Anion gap: 9 (ref 5–15)
BUN: 9 mg/dL (ref 6–20)
CO2: 22 mmol/L (ref 22–32)
Calcium: 8.8 mg/dL — ABNORMAL LOW (ref 8.9–10.3)
Chloride: 107 mmol/L (ref 98–111)
Creatinine, Ser: 0.95 mg/dL (ref 0.44–1.00)
GFR, Estimated: >60 mL/min (ref >60)
Glucose, Bld: 108 mg/dL — ABNORMAL HIGH (ref 70–99)
Potassium: 3.2 mmol/L — ABNORMAL LOW (ref 3.5–5.1)
Sodium: 138 mmol/L (ref 135–145)
Total Bilirubin: 0.3 mg/dL (ref 0.3–1.2)
Total Protein: 6.9 g/dL (ref 6.5–8.1)

## 2022-03-08 LAB — SAMPLE TO BLOOD BANK

## 2022-03-08 LAB — ETHANOL: Alcohol, Ethyl (B): 20 mg/dL — ABNORMAL HIGH (ref <10)

## 2022-03-08 LAB — I-STAT BETA HCG BLOOD, ED (MC, WL, AP ONLY): I-stat hCG, quantitative: <5 m[IU]/mL (ref <5)

## 2022-03-08 MED ORDER — SODIUM CHLORIDE 0.9 % IV BOLUS
1000.0000 mL | Freq: Once | INTRAVENOUS | Status: AC
  Administered 2022-03-08: 1000 mL via INTRAVENOUS

## 2022-03-08 MED ORDER — ONDANSETRON HCL 4 MG/2ML IJ SOLN
4.0000 mg | Freq: Once | INTRAMUSCULAR | Status: AC
  Administered 2022-03-08: 4 mg via INTRAVENOUS
  Filled 2022-03-08: qty 2

## 2022-03-08 MED ORDER — MORPHINE SULFATE (PF) 4 MG/ML IV SOLN
4.0000 mg | Freq: Once | INTRAVENOUS | Status: AC
  Administered 2022-03-08: 4 mg via INTRAVENOUS
  Filled 2022-03-08: qty 1
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
## 2022-03-09 ENCOUNTER — Emergency Department (HOSPITAL_COMMUNITY): Payer: Medicaid Other

## 2022-03-09 ENCOUNTER — Emergency Department (HOSPITAL_COMMUNITY): Payer: Medicaid Other | Admitting: Certified Registered Nurse Anesthetist

## 2022-03-09 ENCOUNTER — Other Ambulatory Visit (HOSPITAL_COMMUNITY): Payer: Medicaid Other

## 2022-03-09 ENCOUNTER — Inpatient Hospital Stay (HOSPITAL_COMMUNITY): Payer: Medicaid Other

## 2022-03-09 ENCOUNTER — Other Ambulatory Visit: Payer: Self-pay

## 2022-03-09 ENCOUNTER — Encounter (HOSPITAL_COMMUNITY): Payer: Self-pay | Admitting: *Deleted

## 2022-03-09 ENCOUNTER — Encounter (HOSPITAL_COMMUNITY)
Admission: EM | Disposition: A | Discharge status: Home / Self Care | Payer: Self-pay | Source: Home / Self Care | Attending: Orthopedic Surgery

## 2022-03-09 DIAGNOSIS — Z20822 Contact with and (suspected) exposure to covid-19: Secondary | ICD-10-CM | POA: Diagnosis present

## 2022-03-09 DIAGNOSIS — S82861A Displaced Maisonneuve's fracture of right leg, initial encounter for closed fracture: Secondary | ICD-10-CM | POA: Diagnosis present

## 2022-03-09 DIAGNOSIS — S82831A Other fracture of upper and lower end of right fibula, initial encounter for closed fracture: Secondary | ICD-10-CM | POA: Diagnosis present

## 2022-03-09 DIAGNOSIS — S82871A Displaced pilon fracture of right tibia, initial encounter for closed fracture: Secondary | ICD-10-CM | POA: Diagnosis present

## 2022-03-09 DIAGNOSIS — S82401A Unspecified fracture of shaft of right fibula, initial encounter for closed fracture: Secondary | ICD-10-CM | POA: Diagnosis not present

## 2022-03-09 DIAGNOSIS — Z8249 Family history of ischemic heart disease and other diseases of the circulatory system: Secondary | ICD-10-CM | POA: Diagnosis not present

## 2022-03-09 DIAGNOSIS — S82209A Unspecified fracture of shaft of unspecified tibia, initial encounter for closed fracture: Secondary | ICD-10-CM | POA: Diagnosis present

## 2022-03-09 DIAGNOSIS — S82201A Unspecified fracture of shaft of right tibia, initial encounter for closed fracture: Secondary | ICD-10-CM | POA: Diagnosis present

## 2022-03-09 DIAGNOSIS — S93431A Sprain of tibiofibular ligament of right ankle, initial encounter: Secondary | ICD-10-CM | POA: Diagnosis present

## 2022-03-09 DIAGNOSIS — D62 Acute posthemorrhagic anemia: Secondary | ICD-10-CM | POA: Diagnosis not present

## 2022-03-09 DIAGNOSIS — E559 Vitamin D deficiency, unspecified: Secondary | ICD-10-CM | POA: Diagnosis present

## 2022-03-09 DIAGNOSIS — K219 Gastro-esophageal reflux disease without esophagitis: Secondary | ICD-10-CM | POA: Diagnosis present

## 2022-03-09 DIAGNOSIS — Y92410 Unspecified street and highway as the place of occurrence of the external cause: Secondary | ICD-10-CM | POA: Diagnosis not present

## 2022-03-09 HISTORY — PX: OPEN REDUCTION INTERNAL FIXATION (ORIF) TIBIA/FIBULA FRACTURE: SHX5992

## 2022-03-09 LAB — LACTIC ACID, PLASMA: Lactic Acid, Venous: 2.9 mmol/L (ref 0.5–1.9)

## 2022-03-09 LAB — RESP PANEL BY RT-PCR (FLU A&B, COVID) ARPGX2
Influenza A by PCR: NEGATIVE
Influenza B by PCR: NEGATIVE
SARS Coronavirus 2 by RT PCR: NEGATIVE

## 2022-03-09 SURGERY — OPEN REDUCTION INTERNAL FIXATION (ORIF) TIBIA/FIBULA FRACTURE
Anesthesia: General | Site: Leg Lower | Laterality: Right

## 2022-03-09 MED ORDER — ONDANSETRON HCL 4 MG/2ML IJ SOLN
INTRAMUSCULAR | Status: AC
  Filled 2022-03-09: qty 2

## 2022-03-09 MED ORDER — GLYCOPYRROLATE 0.2 MG/ML IJ SOLN
INTRAMUSCULAR | Status: DC | PRN
  Administered 2022-03-09: .2 mg via INTRAVENOUS

## 2022-03-09 MED ORDER — MIDAZOLAM HCL 2 MG/2ML IJ SOLN
INTRAMUSCULAR | Status: AC
  Filled 2022-03-09: qty 2

## 2022-03-09 MED ORDER — ONDANSETRON HCL 4 MG/2ML IJ SOLN: 4.0000 mg | Freq: Four times a day (QID) | INTRAMUSCULAR | Status: DC | PRN

## 2022-03-09 MED ORDER — SUGAMMADEX SODIUM 200 MG/2ML IV SOLN
INTRAVENOUS | Status: DC | PRN
  Administered 2022-03-09 (×2): 100 mg via INTRAVENOUS

## 2022-03-09 MED ORDER — MIDAZOLAM HCL 5 MG/5ML IJ SOLN
INTRAMUSCULAR | Status: DC | PRN
  Administered 2022-03-09: 2 mg via INTRAVENOUS

## 2022-03-09 MED ORDER — ONDANSETRON HCL 4 MG/2ML IJ SOLN
INTRAMUSCULAR | Status: DC | PRN
  Administered 2022-03-09: 4 mg via INTRAVENOUS

## 2022-03-09 MED ORDER — FENTANYL CITRATE (PF) 100 MCG/2ML IJ SOLN
25.0000 ug | INTRAMUSCULAR | Status: DC | PRN
  Administered 2022-03-09: 50 ug via INTRAVENOUS

## 2022-03-09 MED ORDER — FENTANYL CITRATE (PF) 250 MCG/5ML IJ SOLN
INTRAMUSCULAR | Status: AC
  Filled 2022-03-09: qty 5

## 2022-03-09 MED ORDER — ACETAMINOPHEN 325 MG PO TABS: 325.0000 mg | ORAL_TABLET | Freq: Four times a day (QID) | ORAL | Status: DC | PRN

## 2022-03-09 MED ORDER — HYDROMORPHONE HCL 1 MG/ML IJ SOLN
0.5000 mg | Freq: Once | INTRAMUSCULAR | Status: AC | PRN
  Administered 2022-03-09: 1 mg via INTRAVENOUS
  Filled 2022-03-09: qty 1

## 2022-03-09 MED ORDER — ONDANSETRON HCL 4 MG PO TABS: 4.0000 mg | ORAL_TABLET | Freq: Four times a day (QID) | ORAL | Status: DC | PRN

## 2022-03-09 MED ORDER — CEFAZOLIN SODIUM-DEXTROSE 1-4 GM/50ML-% IV SOLN
1.0000 g | Freq: Four times a day (QID) | INTRAVENOUS | Status: AC
  Administered 2022-03-09 – 2022-03-10 (×2): 1 g via INTRAVENOUS
  Filled 2022-03-09 (×5): qty 50

## 2022-03-09 MED ORDER — GABAPENTIN 300 MG PO CAPS
300.0000 mg | ORAL_CAPSULE | Freq: Two times a day (BID) | ORAL | Status: DC
  Administered 2022-03-09 – 2022-03-10 (×2): 300 mg via ORAL
  Filled 2022-03-09 (×2): qty 1

## 2022-03-09 MED ORDER — HYDROMORPHONE HCL 1 MG/ML IJ SOLN
0.5000 mg | Freq: Once | INTRAMUSCULAR | Status: AC
  Administered 2022-03-09: 0.5 mg via INTRAVENOUS
  Filled 2022-03-09: qty 1

## 2022-03-09 MED ORDER — DEXAMETHASONE SODIUM PHOSPHATE 10 MG/ML IJ SOLN
INTRAMUSCULAR | Status: DC | PRN
  Administered 2022-03-09: 10 mg via INTRAVENOUS

## 2022-03-09 MED ORDER — CHLORHEXIDINE GLUCONATE 0.12 % MT SOLN
OROMUCOSAL | Status: AC
  Administered 2022-03-09: 15 mL via OROMUCOSAL
  Filled 2022-03-09: qty 15

## 2022-03-09 MED ORDER — METOCLOPRAMIDE HCL 5 MG/ML IJ SOLN: 5.0000 mg | Freq: Three times a day (TID) | INTRAMUSCULAR | Status: DC | PRN

## 2022-03-09 MED ORDER — POTASSIUM CHLORIDE IN NACL 20-0.9 MEQ/L-% IV SOLN
INTRAVENOUS | Status: DC
  Filled 2022-03-09 (×2): qty 1000

## 2022-03-09 MED ORDER — KETOROLAC TROMETHAMINE 30 MG/ML IJ SOLN
INTRAMUSCULAR | Status: AC
  Filled 2022-03-09: qty 1

## 2022-03-09 MED ORDER — KETAMINE HCL 10 MG/ML IJ SOLN
INTRAMUSCULAR | Status: DC | PRN
  Administered 2022-03-09 (×2): 25 mg via INTRAVENOUS

## 2022-03-09 MED ORDER — ORAL CARE MOUTH RINSE: 15.0000 mL | Freq: Once | OROMUCOSAL | Status: AC

## 2022-03-09 MED ORDER — OXYCODONE HCL 5 MG PO TABS
5.0000 mg | ORAL_TABLET | ORAL | Status: DC | PRN
  Administered 2022-03-10: 10 mg via ORAL

## 2022-03-09 MED ORDER — FENTANYL CITRATE (PF) 250 MCG/5ML IJ SOLN
INTRAMUSCULAR | Status: DC | PRN
  Administered 2022-03-09 (×4): 50 ug via INTRAVENOUS
  Administered 2022-03-09: 100 ug via INTRAVENOUS
  Administered 2022-03-09: 50 ug via INTRAVENOUS
  Administered 2022-03-09: 100 ug via INTRAVENOUS

## 2022-03-09 MED ORDER — KETOROLAC TROMETHAMINE 15 MG/ML IJ SOLN
15.0000 mg | Freq: Four times a day (QID) | INTRAMUSCULAR | Status: AC
  Administered 2022-03-09 – 2022-03-10 (×4): 15 mg via INTRAVENOUS
  Filled 2022-03-09 (×4): qty 1

## 2022-03-09 MED ORDER — ROCURONIUM BROMIDE 10 MG/ML (PF) SYRINGE
PREFILLED_SYRINGE | INTRAVENOUS | Status: DC | PRN
  Administered 2022-03-09 (×4): 10 mg via INTRAVENOUS
  Administered 2022-03-09: 60 mg via INTRAVENOUS
  Administered 2022-03-09: 20 mg via INTRAVENOUS

## 2022-03-09 MED ORDER — LIDOCAINE 2% (20 MG/ML) 5 ML SYRINGE
INTRAMUSCULAR | Status: DC | PRN
  Administered 2022-03-09: 60 mg via INTRAVENOUS

## 2022-03-09 MED ORDER — METHOCARBAMOL 500 MG PO TABS
1000.0000 mg | ORAL_TABLET | Freq: Three times a day (TID) | ORAL | Status: DC
  Administered 2022-03-09 – 2022-03-10 (×3): 1000 mg via ORAL
  Filled 2022-03-09 (×3): qty 2

## 2022-03-09 MED ORDER — KETAMINE HCL 50 MG/5ML IJ SOSY
PREFILLED_SYRINGE | INTRAMUSCULAR | Status: AC
  Filled 2022-03-09: qty 5

## 2022-03-09 MED ORDER — PROPOFOL 10 MG/ML IV BOLUS
INTRAVENOUS | Status: AC
  Filled 2022-03-09: qty 20

## 2022-03-09 MED ORDER — ACETAMINOPHEN 10 MG/ML IV SOLN
INTRAVENOUS | Status: DC | PRN
  Administered 2022-03-09: 1000 mg via INTRAVENOUS

## 2022-03-09 MED ORDER — LIDOCAINE 2% (20 MG/ML) 5 ML SYRINGE
INTRAMUSCULAR | Status: AC
  Filled 2022-03-09: qty 5

## 2022-03-09 MED ORDER — HYDROMORPHONE HCL 1 MG/ML IJ SOLN: 0.5000 mg | INTRAMUSCULAR | Status: DC | PRN

## 2022-03-09 MED ORDER — LACTATED RINGERS IV SOLN: INTRAVENOUS | Status: DC | PRN

## 2022-03-09 MED ORDER — CEFAZOLIN SODIUM-DEXTROSE 1-4 GM/50ML-% IV SOLN
INTRAVENOUS | Status: DC | PRN
  Administered 2022-03-09: 2 g via INTRAVENOUS

## 2022-03-09 MED ORDER — PROPOFOL 10 MG/ML IV BOLUS
INTRAVENOUS | Status: DC | PRN
  Administered 2022-03-09: 150 mg via INTRAVENOUS

## 2022-03-09 MED ORDER — ENOXAPARIN SODIUM 40 MG/0.4ML IJ SOSY
40.0000 mg | PREFILLED_SYRINGE | INTRAMUSCULAR | Status: DC
  Administered 2022-03-10: 40 mg via SUBCUTANEOUS
  Filled 2022-03-09: qty 0.4

## 2022-03-09 MED ORDER — FENTANYL CITRATE (PF) 100 MCG/2ML IJ SOLN
INTRAMUSCULAR | Status: AC
  Filled 2022-03-09: qty 2

## 2022-03-09 MED ORDER — POTASSIUM CHLORIDE 10 MEQ/100ML IV SOLN
10.0000 meq | Freq: Once | INTRAVENOUS | Status: AC
  Administered 2022-03-09: 10 meq via INTRAVENOUS
  Filled 2022-03-09: qty 100

## 2022-03-09 MED ORDER — SODIUM CHLORIDE 0.9 % IV BOLUS
1000.0000 mL | Freq: Once | INTRAVENOUS | Status: AC
  Administered 2022-03-09: 1000 mL via INTRAVENOUS

## 2022-03-09 MED ORDER — 0.9 % SODIUM CHLORIDE (POUR BTL) OPTIME
TOPICAL | Status: DC | PRN
  Administered 2022-03-09: 1000 mL

## 2022-03-09 MED ORDER — METOCLOPRAMIDE HCL 5 MG PO TABS: 5.0000 mg | ORAL_TABLET | Freq: Three times a day (TID) | ORAL | Status: DC | PRN

## 2022-03-09 MED ORDER — ACETAMINOPHEN 500 MG PO TABS
1000.0000 mg | ORAL_TABLET | Freq: Four times a day (QID) | ORAL | Status: DC
  Administered 2022-03-09 – 2022-03-10 (×4): 1000 mg via ORAL
  Filled 2022-03-09 (×4): qty 2

## 2022-03-09 MED ORDER — IOHEXOL 350 MG/ML SOLN
75.0000 mL | Freq: Once | INTRAVENOUS | Status: AC | PRN
  Administered 2022-03-09: 75 mL via INTRAVENOUS

## 2022-03-09 MED ORDER — DEXMEDETOMIDINE HCL IN NACL 80 MCG/20ML IV SOLN
INTRAVENOUS | Status: DC | PRN
  Administered 2022-03-09: 8 ug via BUCCAL
  Administered 2022-03-09: 12 ug via BUCCAL

## 2022-03-09 MED ORDER — LACTATED RINGERS IV SOLN: INTRAVENOUS | Status: DC

## 2022-03-09 MED ORDER — ACETAMINOPHEN 10 MG/ML IV SOLN: 1000.0000 mg | Freq: Once | INTRAVENOUS | Status: DC | PRN

## 2022-03-09 MED ORDER — DOCUSATE SODIUM 100 MG PO CAPS
100.0000 mg | ORAL_CAPSULE | Freq: Two times a day (BID) | ORAL | Status: DC
  Administered 2022-03-09 – 2022-03-10 (×2): 100 mg via ORAL
  Filled 2022-03-09 (×2): qty 1

## 2022-03-09 MED ORDER — ACETAMINOPHEN 10 MG/ML IV SOLN
INTRAVENOUS | Status: AC
  Filled 2022-03-09: qty 100

## 2022-03-09 MED ORDER — METHOCARBAMOL 1000 MG/10ML IJ SOLN
500.0000 mg | Freq: Three times a day (TID) | INTRAVENOUS | Status: DC
  Filled 2022-03-09: qty 5

## 2022-03-09 MED ORDER — DEXAMETHASONE SODIUM PHOSPHATE 10 MG/ML IJ SOLN
INTRAMUSCULAR | Status: AC
  Filled 2022-03-09: qty 1

## 2022-03-09 MED ORDER — CHLORHEXIDINE GLUCONATE 0.12 % MT SOLN: 15.0000 mL | Freq: Once | OROMUCOSAL | Status: AC

## 2022-03-09 MED ORDER — OXYCODONE HCL 5 MG PO TABS
10.0000 mg | ORAL_TABLET | ORAL | Status: DC | PRN
  Filled 2022-03-09: qty 3

## 2022-03-09 SURGICAL SUPPLY — 92 items
BAG COUNTER SPONGE SURGICOUNT (BAG) ×1 IMPLANT
BANDAGE ESMARK 6X9 LF (GAUZE/BANDAGES/DRESSINGS) ×1 IMPLANT
BIT DRILL 2.5 X LONG (BIT) ×1
BIT DRILL 2.5X2.75 QC CALB (BIT) IMPLANT
BIT DRILL LCP QC 2X140 (BIT) IMPLANT
BIT DRILL QC 3.5X110 (BIT) IMPLANT
BIT DRILL X LONG 2.5 (BIT) IMPLANT
BLADE CLIPPER SURG (BLADE) IMPLANT
BNDG COHESIVE 4X5 TAN STRL (GAUZE/BANDAGES/DRESSINGS) IMPLANT
BNDG ELASTIC 4X5.8 VLCR STR LF (GAUZE/BANDAGES/DRESSINGS) ×1 IMPLANT
BNDG ELASTIC 6X5.8 VLCR STR LF (GAUZE/BANDAGES/DRESSINGS) ×1 IMPLANT
BNDG ESMARK 6X9 LF (GAUZE/BANDAGES/DRESSINGS) ×1
BNDG GAUZE DERMACEA FLUFF 4 (GAUZE/BANDAGES/DRESSINGS) ×1 IMPLANT
BRUSH SCRUB EZ PLAIN DRY (MISCELLANEOUS) ×2 IMPLANT
COVER MAYO STAND STRL (DRAPES) ×1 IMPLANT
DEVICE FIXATION SYNDESMOSIS (Bone Implant) IMPLANT
DRAPE C-ARM 42X72 X-RAY (DRAPES) ×1 IMPLANT
DRAPE C-ARMOR (DRAPES) ×1 IMPLANT
DRAPE HALF SHEET 40X57 (DRAPES) ×2 IMPLANT
DRAPE INCISE IOBAN 66X45 STRL (DRAPES) ×1 IMPLANT
DRAPE U-SHAPE 47X51 STRL (DRAPES) ×1 IMPLANT
DRILL BIT X LONG 2.5 (BIT) ×1
DRSG ADAPTIC 3X8 NADH LF (GAUZE/BANDAGES/DRESSINGS) ×1 IMPLANT
DRSG MEPITEL 4X7.2 (GAUZE/BANDAGES/DRESSINGS) IMPLANT
ELECT REM PT RETURN 9FT ADLT (ELECTROSURGICAL) ×1
ELECTRODE REM PT RTRN 9FT ADLT (ELECTROSURGICAL) ×1 IMPLANT
FIXATION ZIPTIGHT ANKLE SNDSMS (Ankle) IMPLANT
GAUZE PAD ABD 8X10 STRL (GAUZE/BANDAGES/DRESSINGS) ×4 IMPLANT
GAUZE SPONGE 4X4 12PLY STRL (GAUZE/BANDAGES/DRESSINGS) ×1 IMPLANT
GLOVE BIO SURGEON STRL SZ7.5 (GLOVE) ×1 IMPLANT
GLOVE BIO SURGEON STRL SZ8 (GLOVE) ×1 IMPLANT
GLOVE BIOGEL PI IND STRL 7.5 (GLOVE) ×1 IMPLANT
GLOVE BIOGEL PI IND STRL 8 (GLOVE) ×1 IMPLANT
GLOVE SURG ORTHO LTX SZ7.5 (GLOVE) ×2 IMPLANT
GLOVE XGUARD RR 2 7.5 (GLOVE) ×1 IMPLANT
GLOVE XGUARD RR2 7.5 (GLOVE) ×1
GOWN STRL REUS W/ TWL LRG LVL3 (GOWN DISPOSABLE) ×2 IMPLANT
GOWN STRL REUS W/ TWL XL LVL3 (GOWN DISPOSABLE) ×1 IMPLANT
GOWN STRL REUS W/TWL LRG LVL3 (GOWN DISPOSABLE) ×1
GOWN STRL REUS W/TWL XL LVL3 (GOWN DISPOSABLE) ×1
K-WIRE 1.6X150 (WIRE) ×1
KIT BASIN OR (CUSTOM PROCEDURE TRAY) ×1 IMPLANT
KIT TURNOVER KIT B (KITS) ×1 IMPLANT
KWIRE 1.6X150 (WIRE) IMPLANT
MANIFOLD NEPTUNE II (INSTRUMENTS) ×1 IMPLANT
NDL 18GX1X1/2 (RX/OR ONLY) (NEEDLE) IMPLANT
NEEDLE 18GX1X1/2 (RX/OR ONLY) (NEEDLE) ×1 IMPLANT
NS IRRIG 1000ML POUR BTL (IV SOLUTION) ×1 IMPLANT
PACK ORTHO EXTREMITY (CUSTOM PROCEDURE TRAY) ×1 IMPLANT
PAD ARMBOARD 7.5X6 YLW CONV (MISCELLANEOUS) ×2 IMPLANT
PAD CAST 4YDX4 CTTN HI CHSV (CAST SUPPLIES) ×1 IMPLANT
PADDING CAST COTTON 4X4 STRL (CAST SUPPLIES) ×2
PADDING CAST COTTON 6X4 STRL (CAST SUPPLIES) ×1 IMPLANT
PIN SCHANZ THRD EXFX 6X160 (EXFIX) IMPLANT
PLATE ACE 100DE 10H (Plate) IMPLANT
PLATE LOCK VA-LCP 2.7X202 10H (Plate) IMPLANT
SCREW CORTEX LOW PRO 3.5X24 (Screw) IMPLANT
SCREW CORTEX LOW PRO 3.5X26 (Screw) IMPLANT
SCREW CORTEX LP 3.5X34MM (Screw) IMPLANT
SCREW CORTICAL 3.5MM  12MM (Screw) ×3 IMPLANT
SCREW CORTICAL 3.5MM  20MM (Screw) ×1 IMPLANT
SCREW CORTICAL 3.5MM 12MM (Screw) IMPLANT
SCREW CORTICAL 3.5MM 14MM (Screw) IMPLANT
SCREW CORTICAL 3.5MM 18MM (Screw) IMPLANT
SCREW CORTICAL 3.5MM 20MM (Screw) IMPLANT
SCREW LOCKING 2.7X44MM VA (Screw) IMPLANT
SCREW LOCKING VA 2.7X42 (Screw) IMPLANT
SCREW LOCKING VA 2.7X46 (Screw) IMPLANT
SCREW METAPHYSCAL 46MM (Screw) IMPLANT
SPLINT PLASTER CAST FAST 5X30 (CAST SUPPLIES) IMPLANT
SPONGE T-LAP 18X18 ~~LOC~~+RFID (SPONGE) IMPLANT
STAPLER VISISTAT 35W (STAPLE) ×1 IMPLANT
STOCKINETTE IMPERVIOUS LG (DRAPES) IMPLANT
SUCTION FRAZIER HANDLE 10FR (MISCELLANEOUS) ×1
SUCTION TUBE FRAZIER 10FR DISP (MISCELLANEOUS) ×1 IMPLANT
SUT BONE WAX W31G (SUTURE) IMPLANT
SUT ETHILON 2 0 FS 18 (SUTURE) IMPLANT
SUT ETHILON 2 0 PSLX (SUTURE) IMPLANT
SUT VIC AB 0 CT1 27 (SUTURE)
SUT VIC AB 0 CT1 27XBRD ANBCTR (SUTURE) ×1 IMPLANT
SUT VIC AB 1 CT1 27 (SUTURE)
SUT VIC AB 1 CT1 27XBRD ANBCTR (SUTURE) ×1 IMPLANT
SUT VIC AB 2-0 CT1 27 (SUTURE) ×2
SUT VIC AB 2-0 CT1 TAPERPNT 27 (SUTURE) ×2 IMPLANT
SYR 50ML LL SCALE MARK (SYRINGE) IMPLANT
TOWEL GREEN STERILE (TOWEL DISPOSABLE) ×2 IMPLANT
TOWEL GREEN STERILE FF (TOWEL DISPOSABLE) ×1 IMPLANT
TRAY FOLEY MTR SLVR 16FR STAT (SET/KITS/TRAYS/PACK) IMPLANT
TUBE CONNECTING 12X1/4 (SUCTIONS) ×1 IMPLANT
WATER STERILE IRR 1000ML POUR (IV SOLUTION) ×2 IMPLANT
YANKAUER SUCT BULB TIP NO VENT (SUCTIONS) ×1 IMPLANT
ZIPTIGHT ANKLE SYNODESMOSS FIX (Ankle) ×1 IMPLANT
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
## 2022-03-09 NOTE — ED Notes (Signed)
Patient transported to CT 

## 2022-03-09 NOTE — Transfer of Care (Signed)
Immediate Anesthesia Transfer of Care Note  Patient: Jamie Weeks  Procedure(s) Performed: OPEN REDUCTION INTERNAL FIXATION (ORIF) TIBIA/FIBULA  FRACTURE  RIGHT (Right: Leg Lower)  Patient Location: PACU  Anesthesia Type:General  Level of Consciousness: awake, alert  and oriented  Airway & Oxygen Therapy: Patient Spontanous Breathing and Patient connected to nasal cannula oxygen  Post-op Assessment: Report given to RN and Post -op Vital signs reviewed and stable  Post vital signs: Reviewed and stable  Last Vitals:  Vitals Value Taken Time  BP 119/81 03/09/22 1545  Temp 36.7 C 03/09/22 1535  Pulse 65 03/09/22 1556  Resp 12 03/09/22 1556  SpO2 99 % 03/09/22 1556  Vitals shown include unvalidated device data.  Last Pain:  Vitals:   03/09/22 0916  TempSrc: Oral  PainSc: 9          Complications: No notable events documented.

## 2022-03-09 NOTE — Progress Notes (Signed)
Orthopedic Tech Progress Note Patient Details:  ISABELL BONAFEDE [birth year] [id number]  Ortho Devices Type of Ortho Device: Short leg splint, Stirrup splint Ortho Device/Splint Location: RLE Ortho Device/Splint Interventions: Ordered, Application, Adjustment  Assisted PA with short leg splint. Post Interventions Patient Tolerated: Poor  Hinton Luellen L Claudy Abdallah 03/09/2022, 2:08 AM

## 2022-03-09 NOTE — ED Notes (Signed)
Gave report to Short stay RN

## 2022-03-09 NOTE — ED Notes (Signed)
Notified ED provider Sam, PA-C of patient's lactic of 2.9.

## 2022-03-09 NOTE — Anesthesia Postprocedure Evaluation (Signed)
Anesthesia Post Note  Patient: Jamie Weeks  Procedure(s) Performed: OPEN REDUCTION INTERNAL FIXATION (ORIF) TIBIA/FIBULA  FRACTURE  RIGHT (Right: Leg Lower)     Patient location during evaluation: PACU Anesthesia Type: General Level of consciousness: awake and alert Pain management: pain level controlled Vital Signs Assessment: post-procedure vital signs reviewed and stable Respiratory status: spontaneous breathing, nonlabored ventilation, respiratory function stable and patient connected to nasal cannula oxygen Cardiovascular status: blood pressure returned to baseline and stable Postop Assessment: no apparent nausea or vomiting Anesthetic complications: no   No notable events documented.  Last Vitals:  Vitals:   03/09/22 1645 03/09/22 1731  BP: 123/73 124/83  Pulse: 61 62  Resp: 12 16  Temp: 36.7 C 36.8 C  SpO2: 99% 100%    Last Pain:  Vitals:   03/09/22 1731  TempSrc: Oral  PainSc:                  March Rummage Tee Richeson

## 2022-03-09 NOTE — Anesthesia Preprocedure Evaluation (Addendum)
Anesthesia Evaluation  Patient identified by MRN, date of birth, ID band Patient awake    Reviewed: Allergy & Precautions, NPO status , Patient's Chart, lab work & pertinent test results  Airway Mallampati: I  TM Distance: >3 FB Neck ROM: Full    Dental no notable dental hx.    Pulmonary neg pulmonary ROS,    Pulmonary exam normal        Cardiovascular negative cardio ROS   Rhythm:Regular Rate:Normal     Neuro/Psych negative neurological ROS  negative psych ROS   GI/Hepatic GERD  ,(+)     substance abuse  alcohol use and marijuana use,   Endo/Other  negative endocrine ROS  Renal/GU negative Renal ROS  negative genitourinary   Musculoskeletal Tib/fib fracture    Abdominal Normal abdominal exam  (+)   Peds  Hematology  (+) Blood dyscrasia, anemia ,   Anesthesia Other Findings   Reproductive/Obstetrics                             Anesthesia Physical Anesthesia Plan  ASA: 2  Anesthesia Plan: General   Post-op Pain Management:    Induction: Intravenous  PONV Risk Score and Plan: 3 and Ondansetron, Dexamethasone, Midazolam and Treatment may vary due to age or medical condition  Airway Management Planned: Mask and Oral ETT  Additional Equipment: None  Intra-op Plan:   Post-operative Plan: Extubation in OR  Informed Consent: I have reviewed the patients History and Physical, chart, labs and discussed the procedure including the risks, benefits and alternatives for the proposed anesthesia with the patient or authorized representative who has indicated his/her understanding and acceptance.     Dental advisory given  Plan Discussed with: CRNA  Anesthesia Plan Comments:         Anesthesia Quick Evaluation

## 2022-03-09 NOTE — Anesthesia Procedure Notes (Signed)
Procedure Name: Intubation Date/Time: 03/09/2022 10:58 AM  Performed by: Gayland Curry, CRNAPre-anesthesia Checklist: Patient identified, Emergency Drugs available, Suction available and Patient being monitored Patient Re-evaluated:Patient Re-evaluated prior to induction Oxygen Delivery Method: Circle system utilized Preoxygenation: Pre-oxygenation with 100% oxygen Induction Type: IV induction Ventilation: Mask ventilation without difficulty Laryngoscope Size: Mac and 3 Grade View: Grade I Tube type: Oral Tube size: 7.0 mm Number of attempts: 1 Placement Confirmation: ETT inserted through vocal cords under direct vision, positive ETCO2 and breath sounds checked- equal and bilateral Secured at: 22 cm Tube secured with: Tape Dental Injury: Teeth and Oropharynx as per pre-operative assessment
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
## 2022-03-10 ENCOUNTER — Encounter (HOSPITAL_COMMUNITY): Payer: Self-pay | Admitting: Orthopedic Surgery

## 2022-03-10 DIAGNOSIS — E559 Vitamin D deficiency, unspecified: Secondary | ICD-10-CM

## 2022-03-10 DIAGNOSIS — F1290 Cannabis use, unspecified, uncomplicated: Secondary | ICD-10-CM | POA: Diagnosis present

## 2022-03-10 DIAGNOSIS — S93431A Sprain of tibiofibular ligament of right ankle, initial encounter: Secondary | ICD-10-CM

## 2022-03-10 DIAGNOSIS — S82861A Displaced Maisonneuve's fracture of right leg, initial encounter for closed fracture: Secondary | ICD-10-CM

## 2022-03-10 HISTORY — DX: Displaced Maisonneuve's fracture of right leg, initial encounter for closed fracture: S82.861A

## 2022-03-10 HISTORY — DX: Vitamin D deficiency, unspecified: E55.9

## 2022-03-10 HISTORY — DX: Sprain of tibiofibular ligament of right ankle, initial encounter: S93.431A

## 2022-03-10 LAB — CBC
HCT: 28.8 % — ABNORMAL LOW (ref 36.0–46.0)
Hemoglobin: 9.6 g/dL — ABNORMAL LOW (ref 12.0–15.0)
MCH: 27.4 pg (ref 26.0–34.0)
MCHC: 33.3 g/dL (ref 30.0–36.0)
MCV: 82.1 fL (ref 80.0–100.0)
Platelets: 315 10*3/uL (ref 150–400)
RBC: 3.51 MIL/uL — ABNORMAL LOW (ref 3.87–5.11)
RDW: 14.6 % (ref 11.5–15.5)
WBC: 7.2 10*3/uL (ref 4.0–10.5)
nRBC: 0 % (ref 0.0–0.2)

## 2022-03-10 LAB — BASIC METABOLIC PANEL
Anion gap: 5 (ref 5–15)
BUN: 5 mg/dL — ABNORMAL LOW (ref 6–20)
CO2: 24 mmol/L (ref 22–32)
Calcium: 8.3 mg/dL — ABNORMAL LOW (ref 8.9–10.3)
Chloride: 107 mmol/L (ref 98–111)
Creatinine, Ser: 0.81 mg/dL (ref 0.44–1.00)
GFR, Estimated: >60 mL/min (ref >60)
Glucose, Bld: 118 mg/dL — ABNORMAL HIGH (ref 70–99)
Potassium: 3.6 mmol/L (ref 3.5–5.1)
Sodium: 136 mmol/L (ref 135–145)

## 2022-03-10 LAB — VITAMIN D 25 HYDROXY (VIT D DEFICIENCY, FRACTURES): Vit D, 25-Hydroxy: 11.69 ng/mL — ABNORMAL LOW (ref 30–100)

## 2022-03-10 MED ORDER — VITAMIN D (ERGOCALCIFEROL) 1.25 MG (50000 UNIT) PO CAPS: 50000.0000 [IU] | ORAL_CAPSULE | ORAL | 0 refills | Status: DC

## 2022-03-10 MED ORDER — OXYCODONE-ACETAMINOPHEN 5-325 MG PO TABS: 1.0000 | ORAL_TABLET | Freq: Four times a day (QID) | ORAL | 0 refills | Status: DC | PRN

## 2022-03-10 MED ORDER — VITAMIN D 125 MCG (5000 UT) PO CAPS: 1.0000 | ORAL_CAPSULE | Freq: Every day | ORAL | 6 refills | Status: DC

## 2022-03-10 MED ORDER — DOCUSATE SODIUM 100 MG PO CAPS: 100.0000 mg | ORAL_CAPSULE | Freq: Two times a day (BID) | ORAL | 0 refills | Status: DC

## 2022-03-10 MED ORDER — ASPIRIN 325 MG PO TBEC: 325.0000 mg | DELAYED_RELEASE_TABLET | Freq: Two times a day (BID) | ORAL | 0 refills | Status: DC

## 2022-03-10 MED ORDER — ACETAMINOPHEN 500 MG PO TABS: 500.0000 mg | ORAL_TABLET | Freq: Three times a day (TID) | ORAL | 0 refills | Status: DC

## 2022-03-10 MED ORDER — GABAPENTIN 300 MG PO CAPS: 300.0000 mg | ORAL_CAPSULE | Freq: Two times a day (BID) | ORAL | 0 refills | Status: DC

## 2022-03-10 MED ORDER — METHOCARBAMOL 500 MG PO TABS: 500.0000 mg | ORAL_TABLET | Freq: Three times a day (TID) | ORAL | 0 refills | Status: DC | PRN

## 2022-03-10 MED ORDER — ASPIRIN 325 MG PO TBEC: 325.0000 mg | DELAYED_RELEASE_TABLET | Freq: Two times a day (BID) | ORAL | 0 refills | Status: AC
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
## 2022-03-10 NOTE — Progress Notes (Signed)
Orthopedic Tech Progress Note Patient Details:  Jamie Weeks [birth year] [id number]  Ortho Devices Type of Ortho Device: CAM walker Ortho Device/Splint Location: RLE Ortho Device/Splint Interventions: Ordered   Post Interventions Patient Tolerated: Poor Cam boot dropped off with patient and instructions on bringing it to her appointment provided. [name] 03/10/2022, 1:35 PM

## 2022-03-10 NOTE — Progress Notes (Signed)
Requested Ainsley Spinner, PA send patients pain medications [facility] due to [facility]. PA states he will send script over now.

## 2022-03-10 NOTE — Discharge Instructions (Addendum)
Orthopaedic Trauma Service Discharge Instructions   General Discharge Instructions  Orthopaedic Injuries:  Right tibia and fibula fracture treated with open reduction internal fixation using plate and screws  WEIGHT BEARING STATUS: Nonweightbearing right leg.  Use walker to mobilize.  RANGE OF MOTION/ACTIVITY: Unrestricted motion of knee and toes.  Do not remove splint.  Activity as tolerated while maintaining weightbearing restrictions  Bone health: Labs show vitamin D deficiency.  Please take vitamin D supplements that have been prescribed  Review the following resource for additional information regarding bone health  [email or web address]  Wound Care: Do not remove splint.  Do not get splint wet.  Call office if there are issues with the splint.  You may shower but keep the splint covered with a bag or a cast cover.  Cast cover can be purchased on [employer] or obtained at local pharmacies  [email or web address]  DVT/PE prophylaxis: aspirin 325 mg tablet 2 times a day for 30 days  Diet: as you were eating previously.  Can use over the counter stool softeners and bowel preparations, such as Miralax, to help with bowel movements.  Narcotics can be constipating.  Be sure to drink plenty of fluids  PAIN MEDICATION USE AND EXPECTATIONS  You have likely been given narcotic medications to help control your pain.  After a traumatic event that results in an fracture (broken bone) with or without surgery, it is ok to use narcotic pain medications to help control one's pain.  We understand that everyone responds to pain differently and each individual patient will be evaluated on a regular basis for the continued need for narcotic medications. Ideally, narcotic medication use should last no more than 6-8 weeks (coinciding with fracture healing).   As a patient it is your responsibility as well to monitor narcotic medication use and report the  amount and frequency you use these medications when you come to your office visit.   We would also advise that if you are using narcotic medications, you should take a dose prior to therapy to maximize you participation.  IF YOU ARE ON NARCOTIC MEDICATIONS IT IS NOT PERMISSIBLE TO OPERATE A MOTOR VEHICLE (MOTORCYCLE/CAR/TRUCK/MOPED) OR HEAVY MACHINERY DO NOT MIX NARCOTICS WITH OTHER CNS (CENTRAL NERVOUS SYSTEM) DEPRESSANTS SUCH AS ALCOHOL   POST-OPERATIVE OPIOID TAPER INSTRUCTIONS: It is important to wean off of your opioid medication as soon as possible. If you do not need pain medication after your surgery it is ok to stop day one. Opioids include: Codeine, Hydrocodone(Norco, Vicodin), Oxycodone(Percocet, oxycontin) and hydromorphone amongst others.  Long term and even short term use of opiods can cause: Increased pain response Dependence Constipation Depression Respiratory depression And more.  Withdrawal symptoms can include Flu like symptoms Nausea, vomiting And more Techniques to manage these symptoms Hydrate well Eat regular healthy meals Stay active Use relaxation techniques(deep breathing, meditating, yoga) Do Not substitute Alcohol to help with tapering If you have been on opioids for less than two weeks and do not have pain than it is ok to stop all together.  Plan to wean off of opioids This plan should start within one week post op of your fracture surgery  Maintain the same interval or time between taking each dose and first decrease the dose.  Cut the total daily intake of opioids by one tablet each day Next start to increase the time between doses. The last dose that should be eliminated is the evening dose.    STOP SMOKING OR USING NICOTINE PRODUCTS!!!!  As discussed nicotine  severely impairs your body's ability to heal surgical and traumatic wounds but also impairs bone healing.  Wounds and bone heal by forming microscopic blood vessels (angiogenesis) and  nicotine is a vasoconstrictor (essentially, shrinks blood vessels).  Therefore, if vasoconstriction occurs to these microscopic blood vessels they essentially disappear and are unable to deliver necessary nutrients to the healing tissue.  This is one modifiable factor that you can do to dramatically increase your chances of healing your injury.    (This means no smoking, no nicotine gum, patches, etc)  DO NOT USE NONSTEROIDAL ANTI-INFLAMMATORY DRUGS (NSAID'S)  Using products such as Advil (ibuprofen), Aleve (naproxen), Motrin (ibuprofen) for additional pain control during fracture healing can delay and/or prevent the healing response.  If you would like to take over the counter (OTC) medication, Tylenol (acetaminophen) is ok.  However, some narcotic medications that are given for pain control contain acetaminophen as well. Therefore, you should not exceed more than 4000 mg of tylenol in a day if you do not have liver disease.  Also note that there are may OTC medicines, such as cold medicines and allergy medicines that my contain tylenol as well.  If you have any questions about medications and/or interactions please ask your doctor/PA or your pharmacist.      ICE AND ELEVATE INJURED/OPERATIVE EXTREMITY  Using ice and elevating the injured extremity above your heart can help with swelling and pain control.  Icing in a pulsatile fashion, such as 20 minutes on and 20 minutes off, can be followed.    Do not place ice directly on skin. Make sure there is a barrier between to skin and the ice pack.    Using frozen items such as frozen peas works well as the conform nicely to the are that needs to be iced.  USE AN ACE WRAP OR TED HOSE FOR SWELLING CONTROL  In addition to icing and elevation, Ace wraps or TED hose are used to help limit and resolve swelling.  It is recommended to use Ace wraps or TED hose until you are informed to stop.    When using Ace Wraps start the wrapping distally (farthest away from  the body) and wrap proximally (closer to the body)   Example: If you had surgery on your leg or thing and you do not have a splint on, start the ace wrap at the toes and work your way up to the thigh        If you had surgery on your upper extremity and do not have a splint on, start the ace wrap at your fingers and work your way up to the upper arm  IF YOU ARE IN A SPLINT OR CAST DO NOT [place]   If your splint gets wet for any reason please contact the office immediately. You may shower in your splint or cast as long as you keep it dry.  This can be done by wrapping in a cast cover or garbage back (or similar)  Do Not stick any thing down your splint or cast such as pencils, money, or hangers to try and scratch yourself with.  If you feel itchy take benadryl as prescribed on the bottle for itching  IF YOU ARE IN A CAM BOOT (BLACK BOOT)  You may remove boot periodically. Perform daily dressing changes as noted below.  Wash the liner of the boot regularly and wear a sock when wearing the boot. It is recommended that you sleep in the  boot until told otherwise    Call office for the following: Temperature greater than 101F Persistent nausea and vomiting Severe uncontrolled pain Redness, tenderness, or signs of infection (pain, swelling, redness, odor or green/yellow discharge around the site) Difficulty breathing, headache or visual disturbances Hives Persistent dizziness or light-headedness Extreme fatigue Any other questions or concerns you may have after discharge  In an emergency, call 911 or go to an Emergency Department at a nearby hospital  HELPFUL INFORMATION  If you had a block, it will wear off between 8-24 hrs postop typically.  This is period when your pain may go from nearly zero to the pain you would have had postop without the block.  This is an abrupt transition but nothing dangerous is happening.  You may take an extra dose of narcotic when this  happens.  You should wean off your narcotic medicines as soon as you are able.  Most patients will be off or using minimal narcotics before their first postop appointment.   We suggest you use the pain medication the first night prior to going to bed, in order to ease any pain when the anesthesia wears off. You should avoid taking pain medications on an empty stomach as it will make you nauseous.  Do not drink alcoholic beverages or take illicit drugs when taking pain medications.  In most states it is against the law to drive while you are in a splint or sling.  And certainly against the law to drive while taking narcotics.  You may return to work/school in the next couple of days when you feel up to it.   Pain medication may make you constipated.  Below are a few solutions to try in this order: Decrease the amount of pain medication if you aren't having pain. Drink lots of decaffeinated fluids. Drink prune juice and/or each dried prunes  If the first 3 don't work start with additional solutions Take Colace - an over-the-counter stool softener Take Senokot - an over-the-counter laxative Take Miralax - a stronger over-the-counter laxative     CALL THE OFFICE WITH ANY QUESTIONS OR CONCERNS: [phone]   VISIT OUR WEBSITE FOR ADDITIONAL INFORMATION: [email or web address]

## 2022-03-10 NOTE — TOC Transition Note (Addendum)
Transition of Care [facility]) - CM/SW Discharge Note   Patient Details  Name: [name] MRN: [id number] Date of Birth: January 04, 2001  Transition of [facility]) CM/SW Contact:  Jamie Crews, RN Phone Number: [phone] 03/10/2022, 1:25 PM   Clinical Narrative:     Unable to reach patient on hospital room phone or personal cell. RNCM contact information provided in voicemail to cell. Nursing aware to reach out for additional questions/concerns. Noted PT assessment - recommendations for BSC and RW. Referral to [employer] for delivery to the room.   UPDATE 1350: Spoke with patient at the bedside. Advised of recommendations for RW and BSC. Patient in agreement. Advised of DME to be delivered to the room. Patient to arrange for transportation home.   Final next level of care: Home/Self Care Barriers to Discharge: No Barriers Identified   Patient Goals and CMS Choice        Discharge Placement                       Discharge Plan and Services                DME Arranged: 3-N-1, Walker rolling DME Agency: [employer] Date DME Agency Contacted: 03/10/22 Time DME Agency Contacted: [phone] Representative spoke with at DME Agency: [place]: NA [place] Agency: NA        Social Determinants of Health ([place]) Interventions     Readmission Risk Interventions     No data to display

## 2022-03-10 NOTE — Progress Notes (Signed)
Discharge instructions reviewed with patient, patient verbalizes understanding. Home equipment delivered to the room. Medications reviewed. IV removed. Patient discharged
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
## 2022-03-10 NOTE — Plan of Care (Signed)

## 2022-03-19 ENCOUNTER — Encounter (HOSPITAL_COMMUNITY): Payer: Self-pay | Admitting: Orthopedic Surgery

## 2022-03-27 NOTE — Op Note (Signed)
03/09/2022  #[id number] 
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
## 2022-10-01 ENCOUNTER — Ambulatory Visit (HOSPITAL_COMMUNITY): Payer: Self-pay

## 2022-10-09 ENCOUNTER — Ambulatory Visit (HOSPITAL_COMMUNITY): Payer: Self-pay

## 2022-10-16 ENCOUNTER — Ambulatory Visit (HOSPITAL_COMMUNITY): Payer: Self-pay

## 2022-10-22 ENCOUNTER — Ambulatory Visit (HOSPITAL_COMMUNITY): Payer: Self-pay

## 2022-10-23 ENCOUNTER — Other Ambulatory Visit: Payer: Self-pay

## 2022-10-23 ENCOUNTER — Ambulatory Visit (HOSPITAL_COMMUNITY)
Admission: EM | Admit: 2022-10-23 | Discharge: 2022-10-23 | Disposition: A | Discharge status: Home / Self Care | Payer: Medicaid Other | Attending: Emergency Medicine | Admitting: Emergency Medicine

## 2022-10-23 ENCOUNTER — Encounter (HOSPITAL_COMMUNITY): Payer: Self-pay | Admitting: *Deleted

## 2022-10-23 ENCOUNTER — Ambulatory Visit (HOSPITAL_COMMUNITY): Payer: Self-pay

## 2022-10-23 DIAGNOSIS — J069 Acute upper respiratory infection, unspecified: Secondary | ICD-10-CM | POA: Diagnosis not present

## 2022-10-23 MED ORDER — ACETAMINOPHEN 325 MG PO TABS
975.0000 mg | ORAL_TABLET | Freq: Once | ORAL | Status: AC
  Administered 2022-10-23: 975 mg via ORAL

## 2022-10-23 MED ORDER — ACETAMINOPHEN 325 MG PO TABS
ORAL_TABLET | ORAL | Status: AC
  Filled 2022-10-23: qty 3
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
## 2022-10-23 NOTE — Discharge Instructions (Signed)
Alternate tylenol and ibuprofen every 6 hours for headache/fever. Try Mucinex for congestion and cough. Make sure you are drinking lots of fluids! It may take several days to a week for symptoms to improve an resolve

## 2022-10-23 NOTE — ED Triage Notes (Signed)
Pt reports she feels worse today and vomited. Pt reports she has had chills and HA for 2 days.

## 2022-10-30 ENCOUNTER — Ambulatory Visit (HOSPITAL_COMMUNITY): Payer: Self-pay

## 2022-10-31 ENCOUNTER — Ambulatory Visit (HOSPITAL_COMMUNITY): Payer: Self-pay

## 2022-11-01 ENCOUNTER — Ambulatory Visit (HOSPITAL_COMMUNITY): Payer: Self-pay

## 2022-11-02 ENCOUNTER — Ambulatory Visit (HOSPITAL_COMMUNITY): Payer: Self-pay

## 2022-11-05 ENCOUNTER — Ambulatory Visit (HOSPITAL_COMMUNITY): Payer: Self-pay

## 2022-11-06 ENCOUNTER — Ambulatory Visit (HOSPITAL_COMMUNITY): Payer: Self-pay

## 2022-11-07 ENCOUNTER — Ambulatory Visit (HOSPITAL_COMMUNITY): Payer: Self-pay

## 2022-11-08 ENCOUNTER — Ambulatory Visit (HOSPITAL_COMMUNITY): Payer: Self-pay

## 2022-11-13 ENCOUNTER — Ambulatory Visit (HOSPITAL_COMMUNITY): Payer: Self-pay

## 2022-11-14 ENCOUNTER — Ambulatory Visit (HOSPITAL_COMMUNITY): Payer: Self-pay

## 2022-11-15 ENCOUNTER — Ambulatory Visit (HOSPITAL_COMMUNITY): Payer: Self-pay

## 2022-11-16 ENCOUNTER — Ambulatory Visit (HOSPITAL_COMMUNITY): Payer: Self-pay

## 2022-11-18 ENCOUNTER — Ambulatory Visit (HOSPITAL_COMMUNITY): Payer: Self-pay

## 2022-11-19 ENCOUNTER — Ambulatory Visit
Admission: RE | Admit: 2022-11-19 | Discharge: 2022-11-19 | Disposition: A | Discharge status: Home / Self Care | Payer: Medicaid Other | Source: Ambulatory Visit | Attending: Family Medicine | Admitting: Family Medicine

## 2022-11-19 ENCOUNTER — Other Ambulatory Visit: Payer: Self-pay

## 2022-11-19 ENCOUNTER — Ambulatory Visit (HOSPITAL_COMMUNITY): Payer: Self-pay

## 2022-11-19 VITALS — BP 100/64 | HR 87 | Temp 98.1°F | Resp 18

## 2022-11-19 DIAGNOSIS — R350 Frequency of micturition: Secondary | ICD-10-CM | POA: Insufficient documentation

## 2022-11-19 DIAGNOSIS — R300 Dysuria: Secondary | ICD-10-CM | POA: Insufficient documentation

## 2022-11-19 LAB — POCT URINE PREGNANCY: Preg Test, Ur: NEGATIVE

## 2022-11-19 LAB — POCT URINALYSIS DIP (MANUAL ENTRY)
Bilirubin, UA: NEGATIVE
Blood, UA: NEGATIVE
Glucose, UA: NEGATIVE mg/dL
Ketones, POC UA: NEGATIVE mg/dL
Nitrite, UA: NEGATIVE
Protein Ur, POC: NEGATIVE mg/dL
Spec Grav, UA: 1.025 (ref 1.010–1.025)
Urobilinogen, UA: 0.2 E.U./dL
pH, UA: 7 (ref 5.0–8.0)

## 2022-11-19 MED ORDER — NITROFURANTOIN MONOHYD MACRO 100 MG PO CAPS: 100.0000 mg | ORAL_CAPSULE | Freq: Two times a day (BID) | ORAL | 0 refills | Status: DC

## 2022-11-19 NOTE — Discharge Instructions (Signed)
The urinalysis did show a little bit of white blood cells.  This could be consistent with urinary infection The pregnancy test was negative  Take nitrofurantoin 100 mg--1 capsule 2 times daily for 5 days  Urine culture is sent.  Staff will let you know if you need a different antibiotic.  Also staff will notify you of any positive tests on the vaginal swab we did

## 2022-11-19 NOTE — ED Triage Notes (Signed)
Pt here for dysuria and vaginal discharge x 10 days
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
## 2022-11-20 LAB — URINE CULTURE: Culture: <10000 — AB

## 2022-11-20 LAB — CERVICOVAGINAL ANCILLARY ONLY
Bacterial Vaginitis (gardnerella): POSITIVE — AB
Candida Glabrata: NEGATIVE
Candida Vaginitis: POSITIVE — AB
Chlamydia: NEGATIVE
Comment: NEGATIVE
Comment: NEGATIVE
Comment: NEGATIVE
Comment: NEGATIVE
Comment: NEGATIVE
Comment: NORMAL
Neisseria Gonorrhea: NEGATIVE
Trichomonas: NEGATIVE

## 2022-11-21 ENCOUNTER — Telehealth (HOSPITAL_COMMUNITY): Payer: Self-pay | Admitting: Emergency Medicine

## 2022-11-21 MED ORDER — FLUCONAZOLE 150 MG PO TABS: 150.0000 mg | ORAL_TABLET | Freq: Once | ORAL | 0 refills | Status: AC

## 2022-11-21 MED ORDER — METRONIDAZOLE 500 MG PO TABS: 500.0000 mg | ORAL_TABLET | Freq: Two times a day (BID) | ORAL | 0 refills | Status: DC

## 2023-02-14 ENCOUNTER — Ambulatory Visit (HOSPITAL_COMMUNITY): Payer: Self-pay

## 2023-02-14 ENCOUNTER — Ambulatory Visit
Admission: EM | Admit: 2023-02-14 | Discharge: 2023-02-14 | Disposition: A | Discharge status: Home / Self Care | Payer: Medicaid Other | Attending: Physician Assistant | Admitting: Physician Assistant

## 2023-02-14 DIAGNOSIS — R109 Unspecified abdominal pain: Secondary | ICD-10-CM | POA: Diagnosis present

## 2023-02-14 DIAGNOSIS — R350 Frequency of micturition: Secondary | ICD-10-CM | POA: Diagnosis present

## 2023-02-14 LAB — POCT URINALYSIS DIP (MANUAL ENTRY)
Bilirubin, UA: NEGATIVE
Glucose, UA: NEGATIVE mg/dL
Ketones, POC UA: NEGATIVE mg/dL
Leukocytes, UA: NEGATIVE
Nitrite, UA: NEGATIVE
Spec Grav, UA: >=1.03 — AB (ref 1.010–1.025)
Urobilinogen, UA: 0.2 E.U./dL
pH, UA: 7 (ref 5.0–8.0)

## 2023-02-14 LAB — POCT URINE PREGNANCY: Preg Test, Ur: NEGATIVE
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
## 2023-02-14 NOTE — ED Triage Notes (Signed)
Patient presents to [facility] for abdominal pain and urinary urgency since yesterday. Concerned with UTI. No OTC treatment.

## 2023-02-15 LAB — URINE CULTURE

## 2023-05-21 ENCOUNTER — Ambulatory Visit (HOSPITAL_COMMUNITY): Payer: Self-pay

## 2023-05-28 ENCOUNTER — Encounter: Payer: Self-pay | Admitting: Emergency Medicine

## 2023-05-28 ENCOUNTER — Other Ambulatory Visit: Payer: Self-pay

## 2023-05-28 ENCOUNTER — Ambulatory Visit
Admission: EM | Admit: 2023-05-28 | Discharge: 2023-05-28 | Disposition: A | Discharge status: Home / Self Care | Payer: BC Managed Care – PPO | Attending: Physician Assistant | Admitting: Physician Assistant

## 2023-05-28 DIAGNOSIS — K0889 Other specified disorders of teeth and supporting structures: Secondary | ICD-10-CM

## 2023-05-28 MED ORDER — AMOXICILLIN 500 MG PO CAPS
500.0000 mg | ORAL_CAPSULE | Freq: Three times a day (TID) | ORAL | 0 refills | Status: DC
Start: 2023-05-28 — End: 2023-08-14

## 2023-05-28 MED ORDER — IBUPROFEN 600 MG PO TABS: 600.0000 mg | ORAL_TABLET | Freq: Four times a day (QID) | ORAL | 0 refills | Status: DC | PRN
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
## 2023-05-28 NOTE — ED Triage Notes (Signed)
Pt reports pain due to wisdom teeth x4 days. Worsening pain each day to the point pt can hardly eat with radiation down neck. Pain is worse on L side. Pt has called her dentist along with several others and cannot get in until January.

## 2023-07-27 ENCOUNTER — Ambulatory Visit: Payer: Self-pay

## 2023-08-07 ENCOUNTER — Ambulatory Visit (HOSPITAL_COMMUNITY): Payer: Self-pay

## 2023-08-08 ENCOUNTER — Encounter (HOSPITAL_COMMUNITY): Payer: Self-pay

## 2023-08-08 ENCOUNTER — Ambulatory Visit (HOSPITAL_COMMUNITY)
Admission: RE | Admit: 2023-08-08 | Discharge: 2023-08-08 | Disposition: A | Discharge status: Home / Self Care | Payer: BC Managed Care – PPO | Source: Ambulatory Visit | Attending: Family Medicine | Admitting: Family Medicine

## 2023-08-08 VITALS — BP 106/58 | HR 80 | Temp 98.6°F | Resp 16

## 2023-08-08 DIAGNOSIS — Z113 Encounter for screening for infections with a predominantly sexual mode of transmission: Secondary | ICD-10-CM | POA: Insufficient documentation

## 2023-08-08 DIAGNOSIS — N898 Other specified noninflammatory disorders of vagina: Secondary | ICD-10-CM | POA: Diagnosis present

## 2023-08-08 DIAGNOSIS — L292 Pruritus vulvae: Secondary | ICD-10-CM | POA: Diagnosis not present

## 2023-08-08 LAB — POCT URINE PREGNANCY: Preg Test, Ur: NEGATIVE

## 2023-08-08 MED ORDER — FLUCONAZOLE 150 MG PO TABS: ORAL_TABLET | ORAL | 0 refills | Status: DC
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
## 2023-08-08 NOTE — Discharge Instructions (Addendum)
 You were seen today for vaginal discharge.  Your swab will be resulted tomorrow and if anything is positive we will notify you for treatment.  Your pregnant test was negative.  In the mean time I have sent out medication to treat for possible yeast infection.  Avoid intercourse until all results are completed.

## 2023-08-08 NOTE — ED Triage Notes (Signed)
 Pt states white vaginal discharge with itching for the past 3 days.

## 2023-08-09 ENCOUNTER — Ambulatory Visit (HOSPITAL_COMMUNITY): Payer: Self-pay

## 2023-08-11 ENCOUNTER — Telehealth (HOSPITAL_COMMUNITY): Payer: Self-pay

## 2023-08-11 LAB — CERVICOVAGINAL ANCILLARY ONLY
Bacterial Vaginitis (gardnerella): POSITIVE — AB
Candida Glabrata: NEGATIVE
Candida Vaginitis: POSITIVE — AB
Chlamydia: NEGATIVE
Comment: NEGATIVE
Comment: NEGATIVE
Comment: NEGATIVE
Comment: NEGATIVE
Comment: NEGATIVE
Comment: NORMAL
Neisseria Gonorrhea: NEGATIVE
Trichomonas: POSITIVE — AB

## 2023-08-11 MED ORDER — METRONIDAZOLE 500 MG PO TABS: 500.0000 mg | ORAL_TABLET | Freq: Two times a day (BID) | ORAL | 0 refills | Status: DC

## 2023-08-11 NOTE — Telephone Encounter (Signed)
 Per protocol, pt requires tx with metronidazole. Attempted to reach patient x1. LVM.  Rx sent to pharmacy on file.

## 2023-08-14 ENCOUNTER — Ambulatory Visit (HOSPITAL_COMMUNITY)
Admission: EM | Admit: 2023-08-14 | Discharge: 2023-08-14 | Disposition: A | Discharge status: Home / Self Care | Payer: BC Managed Care – PPO | Attending: Family Medicine | Admitting: Family Medicine

## 2023-08-14 ENCOUNTER — Encounter (HOSPITAL_COMMUNITY): Payer: Self-pay

## 2023-08-14 DIAGNOSIS — R112 Nausea with vomiting, unspecified: Secondary | ICD-10-CM

## 2023-08-14 DIAGNOSIS — K529 Noninfective gastroenteritis and colitis, unspecified: Secondary | ICD-10-CM | POA: Diagnosis not present

## 2023-08-14 MED ORDER — ONDANSETRON 4 MG PO TBDP
ORAL_TABLET | ORAL | Status: AC
  Filled 2023-08-14: qty 1

## 2023-08-14 MED ORDER — ONDANSETRON HCL 4 MG/2ML IJ SOLN
4.0000 mg | Freq: Once | INTRAMUSCULAR | Status: AC
  Administered 2023-08-14: 4 mg via INTRAMUSCULAR

## 2023-08-14 MED ORDER — LOPERAMIDE HCL 2 MG PO TABS
2.0000 mg | ORAL_TABLET | Freq: Four times a day (QID) | ORAL | 0 refills | Status: AC | PRN
Start: 2023-08-14 — End: ?

## 2023-08-14 MED ORDER — ONDANSETRON 4 MG PO TBDP
4.0000 mg | ORAL_TABLET | Freq: Once | ORAL | Status: AC
  Administered 2023-08-14: 4 mg via ORAL

## 2023-08-14 MED ORDER — ONDANSETRON 4 MG PO TBDP
4.0000 mg | ORAL_TABLET | Freq: Three times a day (TID) | ORAL | 0 refills | Status: AC | PRN
Start: 2023-08-14 — End: ?

## 2023-08-14 MED ORDER — ONDANSETRON HCL 4 MG/2ML IJ SOLN
INTRAMUSCULAR | Status: AC
  Filled 2023-08-14: qty 2

## 2023-08-14 NOTE — Discharge Instructions (Signed)
 Please read below to learn more about the medications, dosages and frequencies that I recommend to help alleviate your symptoms and to get you feeling better soon:   Advil, Motrin (ibuprofen): This is a good anti-inflammatory medication which addresses aches, pains and inflammation of the upper airways that causes sinus and nasal congestion as well as in the lower airways which makes your cough feel tight and sometimes burn.  I recommend that you take between 400 to 600 mg every 6-8 hours as needed.  Please do not take more than 2400 mg of ibuprofen in a 24-hour period and please do not take high doses of ibuprofen for more than 3 days in a row as this can lead to stomach ulcers.   Zofran (ondansetron): This is a good antinausea medication that you can take every 8 hours as needed for symptoms of nausea and vomiting.  It is a dissolvable tablet that you do not need to take with water, just put it under your tongue and let it melt away.  I have sent a prescription to your pharmacy.  Your next dose will be due at 2 AM tomorrow.   Imodium (loperamide): This is a good antidiarrheal medication that you can take after every episode of diarrhea to help slow your bowels and reduce the volume as well as the frequency of diarrhea.  You take this medication up to 4 times daily.   If symptoms have not meaningfully improved in the next 3 to 5 days, please return for repeat evaluation or follow-up with your regular provider.  If symptoms have worsened in the next 3 to 5 days, please go to the emergency room for further evaluation.    Thank you for visiting urgent care today.  We appreciate the opportunity to participate in your care.
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
## 2023-08-14 NOTE — ED Triage Notes (Signed)
 Patient reports that she has had V/D, headache, and neck pain since this AM.

## 2023-10-02 ENCOUNTER — Ambulatory Visit: Payer: Self-pay

## 2023-10-09 ENCOUNTER — Ambulatory Visit: Payer: Self-pay

## 2023-10-10 ENCOUNTER — Ambulatory Visit: Payer: Self-pay

## 2023-10-18 ENCOUNTER — Ambulatory Visit: Payer: Self-pay

## 2023-10-20 ENCOUNTER — Ambulatory Visit: Payer: Self-pay

## 2023-10-21 ENCOUNTER — Ambulatory Visit (HOSPITAL_COMMUNITY): Payer: Self-pay

## 2023-10-22 ENCOUNTER — Ambulatory Visit: Payer: Self-pay

## 2023-10-23 ENCOUNTER — Ambulatory Visit (HOSPITAL_COMMUNITY): Payer: Self-pay

## 2023-10-27 ENCOUNTER — Ambulatory Visit: Payer: Self-pay

## 2023-10-28 ENCOUNTER — Ambulatory Visit (HOSPITAL_COMMUNITY): Payer: Self-pay

## 2023-10-29 ENCOUNTER — Ambulatory Visit
Admission: RE | Admit: 2023-10-29 | Discharge: 2023-10-29 | Discharge status: Home / Self Care | Payer: Self-pay | Source: Ambulatory Visit

## 2023-10-29 ENCOUNTER — Ambulatory Visit
Admission: RE | Admit: 2023-10-29 | Discharge: 2023-10-29 | Discharge status: Against Medical Advice | Source: Ambulatory Visit

## 2023-10-29 NOTE — ED Triage Notes (Signed)
 Called from waiting area (x2), No answer, Not present.  Called (phone number), No answer, No VM.  Looked for patient just outside of UC, Patient seems to have left.  Removed from board.  Maude Sorrel CMA

## 2023-10-30 ENCOUNTER — Ambulatory Visit (HOSPITAL_COMMUNITY): Payer: Self-pay

## 2023-11-16 ENCOUNTER — Ambulatory Visit: Payer: Self-pay

## 2023-11-17 ENCOUNTER — Ambulatory Visit (HOSPITAL_COMMUNITY): Payer: Self-pay

## 2023-11-18 ENCOUNTER — Ambulatory Visit (HOSPITAL_COMMUNITY): Payer: Self-pay

## 2023-11-20 ENCOUNTER — Ambulatory Visit (HOSPITAL_COMMUNITY): Payer: Self-pay

## 2023-11-25 ENCOUNTER — Ambulatory Visit (HOSPITAL_COMMUNITY): Payer: Self-pay

## 2023-11-26 ENCOUNTER — Ambulatory Visit (HOSPITAL_COMMUNITY): Payer: Self-pay

## 2023-11-27 ENCOUNTER — Ambulatory Visit (HOSPITAL_COMMUNITY): Payer: Self-pay

## 2023-11-28 ENCOUNTER — Ambulatory Visit (HOSPITAL_COMMUNITY): Payer: Self-pay

## 2023-12-01 ENCOUNTER — Ambulatory Visit (HOSPITAL_COMMUNITY): Payer: Self-pay

## 2023-12-02 ENCOUNTER — Ambulatory Visit (HOSPITAL_COMMUNITY): Payer: Self-pay

## 2023-12-03 ENCOUNTER — Ambulatory Visit (HOSPITAL_COMMUNITY): Payer: Self-pay

## 2023-12-04 ENCOUNTER — Ambulatory Visit (HOSPITAL_COMMUNITY): Payer: Self-pay

## 2023-12-05 ENCOUNTER — Ambulatory Visit (HOSPITAL_COMMUNITY): Payer: Self-pay

## 2023-12-06 ENCOUNTER — Ambulatory Visit (HOSPITAL_COMMUNITY): Payer: Self-pay

## 2023-12-10 ENCOUNTER — Ambulatory Visit (HOSPITAL_COMMUNITY): Payer: Self-pay

## 2023-12-16 ENCOUNTER — Ambulatory Visit (HOSPITAL_COMMUNITY): Payer: Self-pay

## 2023-12-18 ENCOUNTER — Ambulatory Visit (HOSPITAL_COMMUNITY): Payer: Self-pay

## 2023-12-30 ENCOUNTER — Ambulatory Visit (HOSPITAL_COMMUNITY): Payer: Self-pay

## 2024-01-01 ENCOUNTER — Ambulatory Visit (HOSPITAL_COMMUNITY): Payer: Self-pay

## 2024-01-07 ENCOUNTER — Ambulatory Visit (INDEPENDENT_AMBULATORY_CARE_PROVIDER_SITE_OTHER)

## 2024-01-07 ENCOUNTER — Other Ambulatory Visit: Payer: Self-pay

## 2024-01-07 ENCOUNTER — Ambulatory Visit (HOSPITAL_COMMUNITY)
Admission: RE | Admit: 2024-01-07 | Discharge: 2024-01-07 | Disposition: A | Discharge status: Home / Self Care | Payer: Self-pay | Source: Ambulatory Visit

## 2024-01-07 ENCOUNTER — Encounter (HOSPITAL_COMMUNITY): Payer: Self-pay

## 2024-01-07 VITALS — BP 116/80 | HR 90 | Temp 98.4°F | Resp 20

## 2024-01-07 DIAGNOSIS — S9031XA Contusion of right foot, initial encounter: Secondary | ICD-10-CM | POA: Diagnosis not present

## 2024-01-07 MED ORDER — NAPROXEN 500 MG PO TABS
500.0000 mg | ORAL_TABLET | Freq: Two times a day (BID) | ORAL | 0 refills | Status: AC
Start: 2024-01-07 — End: ?
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
## 2024-01-07 NOTE — ED Triage Notes (Addendum)
 PT reports Rt foot and great toe pain. NO injury to foot

## 2024-01-07 NOTE — Discharge Instructions (Addendum)
  1. Contusion of right foot, initial encounter (Primary) - DG Foot Complete Right x-ray performed in UC shows no acute fracture or dislocation, minimal soft tissue swelling, mild spurring noted to the first metatarsal. - naproxen  (NAPROSYN ) 500 MG tablet; Take 1 tablet (500 mg total) by mouth 2 (two) times daily.  Dispense: 30 tablet; Refill: 0 - Apply ace wrap to right foot for compression and protection until injury improves. -Continue to monitor symptoms for any change in severity if there is any escalation of current symptoms or development of new symptoms follow-up in ER for further evaluation and management.

## 2024-01-29 ENCOUNTER — Other Ambulatory Visit: Payer: Self-pay

## 2024-01-29 ENCOUNTER — Ambulatory Visit
Admission: EM | Admit: 2024-01-29 | Discharge: 2024-01-29 | Disposition: A | Discharge status: Home / Self Care | Attending: Family Medicine | Admitting: Family Medicine

## 2024-01-29 ENCOUNTER — Ambulatory Visit: Payer: Self-pay

## 2024-01-29 DIAGNOSIS — S0592XA Unspecified injury of left eye and orbit, initial encounter: Secondary | ICD-10-CM
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
## 2024-01-29 NOTE — Discharge Instructions (Signed)
 Please go to your Worker's Comp. clinic for further evaluation as you have been instructed to do by your employer

## 2024-01-29 NOTE — ED Triage Notes (Signed)
 Pt c/o photosensitivity and painful and making her have Ha, blurred visionx4d. Pt's left sclera is erythematous.

## 2024-02-18 ENCOUNTER — Ambulatory Visit: Payer: Self-pay

## 2024-03-02 ENCOUNTER — Ambulatory Visit: Payer: Self-pay

## 2024-03-02 ENCOUNTER — Ambulatory Visit
Admission: RE | Admit: 2024-03-02 | Discharge: 2024-03-02 | Disposition: A | Discharge status: Home / Self Care | Source: Ambulatory Visit | Attending: Family Medicine | Admitting: Family Medicine

## 2024-03-02 VITALS — BP 111/77 | HR 82 | Temp 98.5°F | Resp 17

## 2024-03-02 DIAGNOSIS — R21 Rash and other nonspecific skin eruption: Secondary | ICD-10-CM | POA: Diagnosis not present

## 2024-03-02 MED ORDER — HYDROXYZINE HCL 25 MG PO TABS
25.0000 mg | ORAL_TABLET | Freq: Three times a day (TID) | ORAL | 0 refills | Status: AC | PRN
Start: 2024-03-02 — End: ?

## 2024-03-02 MED ORDER — TRIAMCINOLONE ACETONIDE 0.1 % EX CREA
1.0000 | TOPICAL_CREAM | Freq: Two times a day (BID) | CUTANEOUS | 0 refills | Status: AC
Start: 2024-03-02 — End: ?

## 2024-03-02 NOTE — Discharge Instructions (Addendum)
 Start triamcinolone  topically twice daily to the areas for itching as needed.  You may also take hydroxyzine  every 8 hours as needed for itching.  Please note this medication will make you drowsy.  Please follow-up with your PCP if your symptoms do not improve.  Please go to the ER for any worsening symptoms.  Hope you feel better soon!

## 2024-03-02 NOTE — ED Triage Notes (Signed)
 Pt present with itchiness on both of her legs. Pt states she has a rash and described the rash as small fine bumps. Pt has not applied anything for relief.
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
## 2024-06-08 ENCOUNTER — Other Ambulatory Visit: Payer: Self-pay

## 2024-06-08 ENCOUNTER — Ambulatory Visit
Admission: EM | Admit: 2024-06-08 | Discharge: 2024-06-08 | Disposition: A | Discharge status: Home / Self Care | Attending: Emergency Medicine | Admitting: Emergency Medicine

## 2024-06-08 DIAGNOSIS — R051 Acute cough: Secondary | ICD-10-CM

## 2024-06-08 DIAGNOSIS — Z20828 Contact with and (suspected) exposure to other viral communicable diseases: Secondary | ICD-10-CM | POA: Diagnosis not present

## 2024-06-08 DIAGNOSIS — J069 Acute upper respiratory infection, unspecified: Secondary | ICD-10-CM

## 2024-06-08 LAB — POC COVID19/FLU A&B COMBO
Covid Antigen, POC: NEGATIVE
Influenza A Antigen, POC: NEGATIVE
Influenza B Antigen, POC: NEGATIVE

## 2024-06-08 MED ORDER — OSELTAMIVIR PHOSPHATE 75 MG PO CAPS: 75.0000 mg | ORAL_CAPSULE | Freq: Two times a day (BID) | ORAL | 0 refills | Status: AC
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
## 2024-06-08 NOTE — ED Triage Notes (Signed)
 Pt presents with c/o cough, headache, sweats, and tightness in chest. Symptoms started yesterday. Currently rates overall pain an 8/10. OTC Aleve  taken for symptoms. Did not seem to help. No fevers. Daughter tested Flu + this past weekend.

## 2024-06-08 NOTE — Discharge Instructions (Addendum)
 Take the Tamiflu  twice daily for the next 5 days to help get you better sooner, this medication can cause abdominal pain, nausea and vomiting Alternate between 800 mg of ibuprofen  and 500 mg of Tylenol  every 4-6 hours You can take 5 mL of Promethazine  DM every 8 hours as needed for cough, this may cause drowsiness or sedation Warm saline gargles, tea with honey and sleep with a humidifier can help with sore throat  The flu is a viral illness that typically last 5 to 7 days.  If no improvement or any new concerning symptoms seek follow-up care

## 2024-06-11 ENCOUNTER — Ambulatory Visit
Admission: RE | Admit: 2024-06-11 | Discharge: 2024-06-11 | Disposition: A | Discharge status: Home / Self Care | Payer: Self-pay | Source: Ambulatory Visit

## 2024-06-11 VITALS — BP 116/77 | HR 87 | Temp 99.2°F | Resp 19 | Ht 67.0 in | Wt 160.0 lb

## 2024-06-11 DIAGNOSIS — B349 Viral infection, unspecified: Secondary | ICD-10-CM | POA: Diagnosis not present

## 2024-06-11 MED ORDER — PROMETHAZINE-DM 6.25-15 MG/5ML PO SYRP: 10.0000 mL | ORAL_SOLUTION | Freq: Three times a day (TID) | ORAL | 0 refills | Status: AC | PRN

## 2024-06-11 MED ORDER — IBUPROFEN 600 MG PO TABS: 600.0000 mg | ORAL_TABLET | Freq: Three times a day (TID) | ORAL | 0 refills | Status: AC | PRN

## 2024-06-11 MED ORDER — AZELASTINE HCL 0.1 % NA SOLN: 1.0000 | Freq: Two times a day (BID) | NASAL | 1 refills | Status: AC

## 2024-06-11 NOTE — Discharge Instructions (Addendum)
" °  1. Acute viral syndrome (Primary) - azelastine  (ASTELIN ) 0.1 % nasal spray; Place 1 spray into both nostrils 2 (two) times daily. Use in each nostril as directed  Dispense: 30 mL; Refill: 1 - promethazine -dextromethorphan  (PROMETHAZINE -DM) 6.25-15 MG/5ML syrup; Take 10 mLs by mouth 3 (three) times daily as needed.  Dispense: 240 mL; Refill: 0 - ibuprofen  (ADVIL ) 600 MG tablet; Take 1 tablet (600 mg total) by mouth every 8 (eight) hours as needed for moderate pain (pain score 4-6), headache or fever.  Dispense: 30 tablet; Refill: 0  -Continue to monitor symptoms for any change in severity if there is any escalation of current symptoms or development of new symptoms follow-up in ER for further evaluation and management. "
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
## 2024-06-11 NOTE — ED Triage Notes (Signed)
 Pt states that she has been running a fever. Pt states she also has a cough, chest congestion, and body aches. X3 days  Pt states that she has been exposed to Flu.
